# Patient Record
Sex: Male | Born: 1945 | Race: White | Hispanic: No | Marital: Married | Smoking: Never smoker
Health system: Southern US, Community
[De-identification: ages and names within clinical notes are randomized; demographics above are authoritative.]

## PROBLEM LIST (undated history)

## (undated) DIAGNOSIS — IMO0002 Reserved for concepts with insufficient information to code with codable children: Secondary | ICD-10-CM

## (undated) DIAGNOSIS — I10 Essential (primary) hypertension: Secondary | ICD-10-CM

## (undated) DIAGNOSIS — K219 Gastro-esophageal reflux disease without esophagitis: Secondary | ICD-10-CM

## (undated) DIAGNOSIS — E785 Hyperlipidemia, unspecified: Secondary | ICD-10-CM

## (undated) HISTORY — PX: APPENDECTOMY: SHX54

## (undated) HISTORY — PX: CHOLECYSTECTOMY: SHX55

---

## 2008-07-24 ENCOUNTER — Encounter: Admission: RE | Admit: 2008-07-24 | Discharge: 2008-07-24 | Payer: Self-pay | Admitting: Orthopedic Surgery

## 2013-09-29 ENCOUNTER — Inpatient Hospital Stay (HOSPITAL_COMMUNITY): Payer: Medicare HMO

## 2013-09-29 ENCOUNTER — Encounter (HOSPITAL_COMMUNITY): Payer: Self-pay | Admitting: Physician Assistant

## 2013-09-29 ENCOUNTER — Inpatient Hospital Stay (HOSPITAL_COMMUNITY)
Admission: EM | Admit: 2013-09-29 | Discharge: 2013-10-11 | DRG: 871 | Disposition: E | Payer: Medicare HMO | Source: Other Acute Inpatient Hospital | Attending: Pulmonary Disease | Admitting: Pulmonary Disease

## 2013-09-29 DIAGNOSIS — E785 Hyperlipidemia, unspecified: Secondary | ICD-10-CM | POA: Diagnosis present

## 2013-09-29 DIAGNOSIS — R40243 Glasgow coma scale score 3-8, unspecified time: Secondary | ICD-10-CM

## 2013-09-29 DIAGNOSIS — J9600 Acute respiratory failure, unspecified whether with hypoxia or hypercapnia: Secondary | ICD-10-CM | POA: Diagnosis present

## 2013-09-29 DIAGNOSIS — T50995A Adverse effect of other drugs, medicaments and biological substances, initial encounter: Secondary | ICD-10-CM | POA: Diagnosis present

## 2013-09-29 DIAGNOSIS — R319 Hematuria, unspecified: Secondary | ICD-10-CM | POA: Diagnosis present

## 2013-09-29 DIAGNOSIS — A419 Sepsis, unspecified organism: Secondary | ICD-10-CM | POA: Diagnosis present

## 2013-09-29 DIAGNOSIS — E872 Acidosis, unspecified: Secondary | ICD-10-CM | POA: Diagnosis present

## 2013-09-29 DIAGNOSIS — I214 Non-ST elevation (NSTEMI) myocardial infarction: Secondary | ICD-10-CM | POA: Diagnosis present

## 2013-09-29 DIAGNOSIS — J9690 Respiratory failure, unspecified, unspecified whether with hypoxia or hypercapnia: Secondary | ICD-10-CM | POA: Diagnosis present

## 2013-09-29 DIAGNOSIS — I4891 Unspecified atrial fibrillation: Secondary | ICD-10-CM | POA: Diagnosis present

## 2013-09-29 DIAGNOSIS — K7200 Acute and subacute hepatic failure without coma: Secondary | ICD-10-CM | POA: Diagnosis present

## 2013-09-29 DIAGNOSIS — R109 Unspecified abdominal pain: Secondary | ICD-10-CM

## 2013-09-29 DIAGNOSIS — D696 Thrombocytopenia, unspecified: Secondary | ICD-10-CM

## 2013-09-29 DIAGNOSIS — N179 Acute kidney failure, unspecified: Secondary | ICD-10-CM

## 2013-09-29 DIAGNOSIS — R40244 Other coma, without documented Glasgow coma scale score, or with partial score reported, unspecified time: Secondary | ICD-10-CM

## 2013-09-29 DIAGNOSIS — Z66 Do not resuscitate: Secondary | ICD-10-CM | POA: Diagnosis not present

## 2013-09-29 DIAGNOSIS — D6959 Other secondary thrombocytopenia: Secondary | ICD-10-CM | POA: Diagnosis present

## 2013-09-29 DIAGNOSIS — R579 Shock, unspecified: Secondary | ICD-10-CM | POA: Diagnosis present

## 2013-09-29 DIAGNOSIS — Z79899 Other long term (current) drug therapy: Secondary | ICD-10-CM | POA: Diagnosis not present

## 2013-09-29 DIAGNOSIS — E669 Obesity, unspecified: Secondary | ICD-10-CM | POA: Diagnosis present

## 2013-09-29 DIAGNOSIS — R6521 Severe sepsis with septic shock: Secondary | ICD-10-CM

## 2013-09-29 DIAGNOSIS — J9601 Acute respiratory failure with hypoxia: Secondary | ICD-10-CM

## 2013-09-29 DIAGNOSIS — R4020 Unspecified coma: Secondary | ICD-10-CM

## 2013-09-29 DIAGNOSIS — R7989 Other specified abnormal findings of blood chemistry: Secondary | ICD-10-CM

## 2013-09-29 DIAGNOSIS — M6282 Rhabdomyolysis: Secondary | ICD-10-CM | POA: Diagnosis not present

## 2013-09-29 DIAGNOSIS — I10 Essential (primary) hypertension: Secondary | ICD-10-CM | POA: Diagnosis present

## 2013-09-29 DIAGNOSIS — E869 Volume depletion, unspecified: Secondary | ICD-10-CM | POA: Diagnosis present

## 2013-09-29 DIAGNOSIS — K219 Gastro-esophageal reflux disease without esophagitis: Secondary | ICD-10-CM | POA: Diagnosis present

## 2013-09-29 DIAGNOSIS — I498 Other specified cardiac arrhythmias: Secondary | ICD-10-CM | POA: Diagnosis present

## 2013-09-29 DIAGNOSIS — IMO0002 Reserved for concepts with insufficient information to code with codable children: Secondary | ICD-10-CM | POA: Diagnosis not present

## 2013-09-29 DIAGNOSIS — R6520 Severe sepsis without septic shock: Secondary | ICD-10-CM

## 2013-09-29 DIAGNOSIS — R778 Other specified abnormalities of plasma proteins: Secondary | ICD-10-CM

## 2013-09-29 DIAGNOSIS — J95821 Acute postprocedural respiratory failure: Secondary | ICD-10-CM

## 2013-09-29 DIAGNOSIS — R4182 Altered mental status, unspecified: Secondary | ICD-10-CM | POA: Diagnosis present

## 2013-09-29 HISTORY — DX: Gastro-esophageal reflux disease without esophagitis: K21.9

## 2013-09-29 HISTORY — DX: Reserved for concepts with insufficient information to code with codable children: IMO0002

## 2013-09-29 HISTORY — DX: Hyperlipidemia, unspecified: E78.5

## 2013-09-29 HISTORY — DX: Essential (primary) hypertension: I10

## 2013-09-29 LAB — STREP PNEUMONIAE URINARY ANTIGEN: STREP PNEUMO URINARY ANTIGEN: NEGATIVE

## 2013-09-29 LAB — RENAL FUNCTION PANEL
ALBUMIN: 1.8 g/dL — AB (ref 3.5–5.2)
Anion gap: 28 — ABNORMAL HIGH (ref 5–15)
BUN: 60 mg/dL — ABNORMAL HIGH (ref 6–23)
CO2: 9 mEq/L — CL (ref 19–32)
Calcium: 6.7 mg/dL — ABNORMAL LOW (ref 8.4–10.5)
Chloride: 100 mEq/L (ref 96–112)
Creatinine, Ser: 3.07 mg/dL — ABNORMAL HIGH (ref 0.50–1.35)
GFR calc non Af Amer: 20 mL/min — ABNORMAL LOW (ref >90)
GFR, EST AFRICAN AMERICAN: 23 mL/min — AB (ref >90)
Glucose, Bld: 161 mg/dL — ABNORMAL HIGH (ref 70–99)
PHOSPHORUS: 6.5 mg/dL — AB (ref 2.3–4.6)
POTASSIUM: 4.4 meq/L (ref 3.7–5.3)
SODIUM: 137 meq/L (ref 137–147)

## 2013-09-29 LAB — BLOOD GAS, ARTERIAL
Acid-base deficit: 17.3 mmol/L — ABNORMAL HIGH (ref 0.0–2.0)
Bicarbonate: 10.8 mEq/L — ABNORMAL LOW (ref 20.0–24.0)
Drawn by: 249101
FIO2: 0.5 %
MECHVT: 620 mL
O2 Saturation: 90.4 %
PATIENT TEMPERATURE: 98.6
PCO2 ART: 38.9 mmHg (ref 35.0–45.0)
PEEP/CPAP: 5 cmH2O
PH ART: 7.072 — AB (ref 7.350–7.450)
RATE: 16 resp/min
TCO2: 12 mmol/L (ref 0–100)
pO2, Arterial: 73.1 mmHg — ABNORMAL LOW (ref 80.0–100.0)

## 2013-09-29 LAB — BASIC METABOLIC PANEL
ANION GAP: 28 — AB (ref 5–15)
BUN: 59 mg/dL — ABNORMAL HIGH (ref 6–23)
CALCIUM: 6.5 mg/dL — AB (ref 8.4–10.5)
CHLORIDE: 95 meq/L — AB (ref 96–112)
CO2: 9 mEq/L — CL (ref 19–32)
CREATININE: 3.06 mg/dL — AB (ref 0.50–1.35)
GFR calc non Af Amer: 20 mL/min — ABNORMAL LOW (ref >90)
GFR, EST AFRICAN AMERICAN: 23 mL/min — AB (ref >90)
Glucose, Bld: 196 mg/dL — ABNORMAL HIGH (ref 70–99)
Potassium: 4.6 mEq/L (ref 3.7–5.3)
SODIUM: 132 meq/L — AB (ref 137–147)

## 2013-09-29 LAB — URINALYSIS, ROUTINE W REFLEX MICROSCOPIC
Glucose, UA: NEGATIVE mg/dL
Ketones, ur: 15 mg/dL — AB
Nitrite: POSITIVE — AB
PH: 5 (ref 5.0–8.0)
Protein, ur: 30 mg/dL — AB
SPECIFIC GRAVITY, URINE: 1.039 — AB (ref 1.005–1.030)
Urobilinogen, UA: 1 mg/dL (ref 0.0–1.0)

## 2013-09-29 LAB — COMPREHENSIVE METABOLIC PANEL
ALT: 90 U/L — ABNORMAL HIGH (ref 0–53)
AST: 200 U/L — ABNORMAL HIGH (ref 0–37)
Albumin: 1.8 g/dL — ABNORMAL LOW (ref 3.5–5.2)
Alkaline Phosphatase: 84 U/L (ref 39–117)
Anion gap: 27 — ABNORMAL HIGH (ref 5–15)
BUN: 59 mg/dL — ABNORMAL HIGH (ref 6–23)
CO2: 10 meq/L — AB (ref 19–32)
CREATININE: 3.08 mg/dL — AB (ref 0.50–1.35)
Calcium: 6.8 mg/dL — ABNORMAL LOW (ref 8.4–10.5)
Chloride: 101 mEq/L (ref 96–112)
GFR, EST AFRICAN AMERICAN: 23 mL/min — AB (ref >90)
GFR, EST NON AFRICAN AMERICAN: 19 mL/min — AB (ref >90)
GLUCOSE: 108 mg/dL — AB (ref 70–99)
Potassium: 4.5 mEq/L (ref 3.7–5.3)
Sodium: 138 mEq/L (ref 137–147)
Total Bilirubin: 4.8 mg/dL — ABNORMAL HIGH (ref 0.3–1.2)
Total Protein: 3.8 g/dL — ABNORMAL LOW (ref 6.0–8.3)

## 2013-09-29 LAB — TROPONIN I
TROPONIN I: 2.9 ng/mL — AB (ref <0.30)
Troponin I: 1.9 ng/mL (ref <0.30)

## 2013-09-29 LAB — CBC
HCT: 47.2 % (ref 39.0–52.0)
HEMOGLOBIN: 16.2 g/dL (ref 13.0–17.0)
MCH: 31.3 pg (ref 26.0–34.0)
MCHC: 34.3 g/dL (ref 30.0–36.0)
MCV: 91.1 fL (ref 78.0–100.0)
Platelets: 65 10*3/uL — ABNORMAL LOW (ref 150–400)
RBC: 5.18 MIL/uL (ref 4.22–5.81)
RDW: 14.2 % (ref 11.5–15.5)
WBC: 16.2 10*3/uL — ABNORMAL HIGH (ref 4.0–10.5)

## 2013-09-29 LAB — GLUCOSE, CAPILLARY
Glucose-Capillary: 99 mg/dL (ref 70–99)
Glucose-Capillary: 158 mg/dL — ABNORMAL HIGH (ref 70–99)

## 2013-09-29 LAB — PRO B NATRIURETIC PEPTIDE: PRO B NATRI PEPTIDE: 29878 pg/mL — AB (ref 0–125)

## 2013-09-29 LAB — LIPASE, BLOOD: Lipase: 30 U/L (ref 11–59)

## 2013-09-29 LAB — TSH: TSH: 1.69 u[IU]/mL (ref 0.350–4.500)

## 2013-09-29 LAB — URINE MICROSCOPIC-ADD ON

## 2013-09-29 LAB — MAGNESIUM: MAGNESIUM: 2.3 mg/dL (ref 1.5–2.5)

## 2013-09-29 LAB — PHOSPHORUS: Phosphorus: 6.3 mg/dL — ABNORMAL HIGH (ref 2.3–4.6)

## 2013-09-29 LAB — LACTIC ACID, PLASMA: Lactic Acid, Venous: 11.1 mmol/L — ABNORMAL HIGH (ref 0.5–2.2)

## 2013-09-29 LAB — PROTIME-INR
INR: 1.28 (ref 0.00–1.49)
PROTHROMBIN TIME: 16 s — AB (ref 11.6–15.2)

## 2013-09-29 LAB — AMYLASE: Amylase: 182 U/L — ABNORMAL HIGH (ref 0–105)

## 2013-09-29 LAB — PROCALCITONIN: PROCALCITONIN: 16.46 ng/mL

## 2013-09-29 LAB — DIGOXIN LEVEL: DIGOXIN LVL: 1.6 ng/mL (ref 0.8–2.0)

## 2013-09-29 MED ORDER — SODIUM BICARBONATE 8.4 % IV SOLN
INTRAVENOUS | Status: DC
  Administered 2013-09-29 – 2013-09-30 (×3): via INTRAVENOUS_CENTRAL
  Filled 2013-09-29 (×5): qty 150

## 2013-09-29 MED ORDER — AMIODARONE HCL IN DEXTROSE 360-4.14 MG/200ML-% IV SOLN
60.0000 mg/h | INTRAVENOUS | Status: DC
  Filled 2013-09-29: qty 200

## 2013-09-29 MED ORDER — FENTANYL CITRATE 0.05 MG/ML IJ SOLN
0.0000 ug/h | INTRAMUSCULAR | Status: DC
  Administered 2013-09-29: 50 ug/h via INTRAVENOUS
  Filled 2013-09-29: qty 50

## 2013-09-29 MED ORDER — SODIUM CHLORIDE 0.9 % IV SOLN
INTRAVENOUS | Status: DC
  Administered 2013-09-29 – 2013-09-30 (×2): via INTRAVENOUS

## 2013-09-29 MED ORDER — FENTANYL CITRATE 0.05 MG/ML IJ SOLN: 50.0000 ug | Freq: Once | INTRAMUSCULAR | Status: DC

## 2013-09-29 MED ORDER — PANTOPRAZOLE SODIUM 40 MG IV SOLR
40.0000 mg | Freq: Every day | INTRAVENOUS | Status: DC
  Administered 2013-09-29: 40 mg via INTRAVENOUS
  Filled 2013-09-29 (×2): qty 40

## 2013-09-29 MED ORDER — VANCOMYCIN HCL IN DEXTROSE 1-5 GM/200ML-% IV SOLN
1000.0000 mg | INTRAVENOUS | Status: DC
  Filled 2013-09-29: qty 200

## 2013-09-29 MED ORDER — PHENYLEPHRINE HCL 10 MG/ML IJ SOLN
30.0000 ug/min | INTRAVENOUS | Status: DC
  Administered 2013-09-29 – 2013-09-30 (×5): 200 ug/min via INTRAVENOUS
  Filled 2013-09-29 (×5): qty 4

## 2013-09-29 MED ORDER — HEPARIN (PORCINE) 2000 UNITS/L FOR CRRT
INTRAVENOUS_CENTRAL | Status: DC | PRN
  Filled 2013-09-29: qty 1000

## 2013-09-29 MED ORDER — SODIUM CHLORIDE 0.9 % IV SOLN
500.0000 mg | Freq: Four times a day (QID) | INTRAVENOUS | Status: DC
  Administered 2013-09-30 (×2): 500 mg via INTRAVENOUS
  Filled 2013-09-29 (×4): qty 500

## 2013-09-29 MED ORDER — PIPERACILLIN-TAZOBACTAM 3.375 G IVPB 30 MIN
3.3750 g | INTRAVENOUS | Status: AC
  Administered 2013-09-29: 3.375 g via INTRAVENOUS
  Filled 2013-09-29: qty 50

## 2013-09-29 MED ORDER — NOREPINEPHRINE BITARTRATE 1 MG/ML IV SOLN
2.0000 ug/min | INTRAVENOUS | Status: DC
  Administered 2013-09-29: 25 ug/min via INTRAVENOUS
  Administered 2013-09-29: 50 ug/min via INTRAVENOUS
  Filled 2013-09-29 (×4): qty 16

## 2013-09-29 MED ORDER — CHLORHEXIDINE GLUCONATE 0.12 % MT SOLN
OROMUCOSAL | Status: AC
  Administered 2013-09-29: 15 mL
  Filled 2013-09-29: qty 15

## 2013-09-29 MED ORDER — FENTANYL BOLUS VIA INFUSION
25.0000 ug | INTRAVENOUS | Status: DC | PRN
  Filled 2013-09-29: qty 50

## 2013-09-29 MED ORDER — CHLORHEXIDINE GLUCONATE 0.12 % MT SOLN
15.0000 mL | Freq: Two times a day (BID) | OROMUCOSAL | Status: DC
  Administered 2013-09-30: 15 mL via OROMUCOSAL
  Filled 2013-09-29: qty 15

## 2013-09-29 MED ORDER — HYDROCORTISONE NA SUCCINATE PF 100 MG IJ SOLR
100.0000 mg | Freq: Four times a day (QID) | INTRAMUSCULAR | Status: DC
  Administered 2013-09-29 – 2013-09-30 (×4): 100 mg via INTRAVENOUS
  Filled 2013-09-29 (×8): qty 2

## 2013-09-29 MED ORDER — CETYLPYRIDINIUM CHLORIDE 0.05 % MT LIQD
7.0000 mL | Freq: Four times a day (QID) | OROMUCOSAL | Status: DC
  Administered 2013-09-30 (×2): 7 mL via OROMUCOSAL

## 2013-09-29 MED ORDER — PRISMASOL BGK 4/2.5 32-4-2.5 MEQ/L IV SOLN
INTRAVENOUS | Status: DC
  Administered 2013-09-29 – 2013-09-30 (×5): via INTRAVENOUS_CENTRAL
  Filled 2013-09-29 (×10): qty 5000

## 2013-09-29 MED ORDER — VANCOMYCIN HCL 10 G IV SOLR
1750.0000 mg | Freq: Once | INTRAVENOUS | Status: AC
  Administered 2013-09-29: 1750 mg via INTRAVENOUS
  Filled 2013-09-29: qty 1750

## 2013-09-29 MED ORDER — VASOPRESSIN 20 UNIT/ML IJ SOLN
0.0300 [IU]/min | INTRAVENOUS | Status: DC
  Administered 2013-09-29: 0.03 [IU]/min via INTRAVENOUS
  Filled 2013-09-29 (×2): qty 2

## 2013-09-29 MED ORDER — SODIUM CHLORIDE 0.9 % IV SOLN: 250.0000 mL | INTRAVENOUS | Status: DC | PRN

## 2013-09-29 MED ORDER — HEPARIN SODIUM (PORCINE) 1000 UNIT/ML DIALYSIS
1000.0000 [IU] | INTRAMUSCULAR | Status: DC | PRN
  Administered 2013-09-29: 2400 [IU] via INTRAVENOUS_CENTRAL
  Filled 2013-09-29: qty 1
  Filled 2013-09-29: qty 6
  Filled 2013-09-29: qty 2

## 2013-09-29 MED ORDER — NOREPINEPHRINE BITARTRATE 1 MG/ML IV SOLN
2.0000 ug/min | INTRAVENOUS | Status: DC
  Administered 2013-09-29: 50 ug/min via INTRAVENOUS
  Filled 2013-09-29: qty 4

## 2013-09-29 MED ORDER — AMIODARONE HCL IN DEXTROSE 360-4.14 MG/200ML-% IV SOLN: 30.0000 mg/h | INTRAVENOUS | Status: DC

## 2013-09-29 MED ORDER — VANCOMYCIN HCL IN DEXTROSE 1-5 GM/200ML-% IV SOLN: 1000.0000 mg | INTRAVENOUS | Status: DC

## 2013-09-29 MED ORDER — SODIUM CHLORIDE 0.9 % IV SOLN
500.0000 mg | INTRAVENOUS | Status: AC
  Administered 2013-09-29: 500 mg via INTRAVENOUS
  Filled 2013-09-29: qty 500

## 2013-09-29 MED ORDER — DEXTROSE 5 % IV SOLN
INTRAVENOUS | Status: DC
  Administered 2013-09-29 (×2): via INTRAVENOUS
  Filled 2013-09-29 (×3): qty 150

## 2013-09-29 MED ORDER — DOXYCYCLINE HYCLATE 100 MG IV SOLR
100.0000 mg | Freq: Two times a day (BID) | INTRAVENOUS | Status: DC
  Administered 2013-09-29 – 2013-09-30 (×2): 100 mg via INTRAVENOUS
  Filled 2013-09-29 (×3): qty 100

## 2013-09-29 MED ORDER — PRISMASOL BGK 4/2.5 32-4-2.5 MEQ/L IV SOLN
INTRAVENOUS | Status: DC
  Administered 2013-09-29 – 2013-09-30 (×2): via INTRAVENOUS_CENTRAL
  Filled 2013-09-29 (×4): qty 5000

## 2013-09-29 NOTE — Consult Note (Signed)
CARDIOLOGY CONSULT NOTE   Patient ID: Jared Ingram MRN: [id number] DOB/AGE: 67-Apr-1947 67 y.o.  Admit date: 10/05/2013  Primary Physician   Lucila Maine, MD Primary Cardiologist   New Reason for Consultation   Shock, elevated troponin  WUJ:WJXB Jared Ingram is a 68 y.o. male with no history of CAD. History of HLD, HTN, GERD. He had been on Levaquin prior to admission to [facility] on 08/17, reason unclear. He was admitted for N&V that had started 4 days prior to the date of admission, hematuria. He was hypotensive, and GI symptoms were difficult to control.   Initial labs: WBC 8.1 77% SEGS   HGB 17.1    HCT 50.0    MCV 91    PLT 76         NA 133    K 4.1    CL 97    CO2 28    ANION GAP  12    BUN 31    CR 1.30    GLU 175    CA 8.2    T BILI 3.1         LFTs OK                He deteriorated on 08/19 from the notes, continuing to have fevers. He was intubated and required pressors. His bilirubin and transaminases continued to increase. His UOP decreased. His platelets were low on admission, and decreased. He was acidotic and this worsened. A central line was placed.   A d-dimer was elevated on 08/19 and a CTA was performed, negative for PE.   He developed atrial fibrillation with rapid ventricular response and had elevated cardiac enzymes, indicating a NSTEMI. He was started on digoxin, no BB/CCB due to hypotension.   He was transferred to [facility], to the hospitalist service and cardiology was asked to evaluate him.   [facility] labs from today:  ABG: 7.29 25   PCO2 70  PO2  12   HCO3            WBC 6.3    HGB 16.7    HCT 49.6    PLT 38         NA 133    K 4.2    CL 100    CO2 17    BUN 20    CR 2.40    GLU 104    CA 7.1    Corrected CA 8.9    T PROT 4.3    ALB 2.2    T BILI 7.0     Troponin I: 1.16 1.79 2.04 1.40     Past Medical History  Diagnosis Date  . GERD (gastroesophageal reflux disease)   . Essential hypertension   .  Herniated disc   . Hyperlipidemia      Past Surgical History  Procedure Laterality Date  . Appendectomy    . Cholecystectomy      Allergies  Allergen Reactions  . Sulfa Antibiotics Other (See Comments)    unspecified    I have reviewed the patient's current medications . doxycycline (VIBRAMYCIN) IV  100 mg Intravenous Q12H  . hydrocortisone sod succinate (SOLU-CORTEF) inj  100 mg Intravenous Q6H  . imipenem-cilastatin  500 mg Intravenous STAT  . pantoprazole (PROTONIX) IV  40 mg Intravenous QHS  . vancomycin  1,750 mg Intravenous Once   . sodium chloride 100 mL/hr at 10/09/2013 1434  . norepinephrine (LEVOPHED) Adult infusion 50 mcg/min (09/16/2013 1451)  .  phenylephrine (NEO-SYNEPHRINE) Adult infusion 200 mcg/min (09/21/2013 1400)  .  sodium bicarbonate  infusion 1000 mL 125 mL/hr at 10/04/2013 1440  . vasopressin (PITRESSIN) infusion - *FOR SHOCK* 0.03 Units/min (09/24/2013 1441)   sodium chloride  Prior to Admission medications   Medication Sig Start Date  levofloxacin (LEVAQUIN) 500 MG tablet Take 500 mg by mouth 2 (two) times daily. 09/24/13  losartan (COZAAR) 25 MG tablet Take 12.5 mg by mouth daily. 08/09/13  pantoprazole (PROTONIX) 40 MG tablet Take 40 mg by mouth daily. 09/23/13  pravastatin (PRAVACHOL) 20 MG tablet Take 20 mg by mouth daily. 08/31/13  predniSONE (DELTASONE) 10 MG tablet Take 10 mg by mouth daily. 09/24/13        History   Social History  . Marital Status: Married    Spouse Name: N/A    Number of Children: N/A  . Years of Education: N/A   Occupational History  . Retired   Social History Main Topics  . Smoking status: Never  . Smokeless tobacco: Never  . Alcohol Use: No  . Drug Use: Never  . Sexual Activity: Not on file   Other Topics Concern  . Not on file   Social History Narrative  . Lives with wife    Family Status - no further information available due to patient condition  Relation Status Death Age  . Mother Deceased   . Father  Deceased      ROS:  Not available due to patient condition  Physical Exam:  Pulse 84, resp. rate 16, height 5\' 11"  (1.803 m), weight 223 lb 12.3 oz (101.5 kg), SpO2 90.00%.  General: Well developed, well nourished, male intubated on the vent. Head: Eyes PERRLA, No xanthomas.   Normocephalic and atraumatic, oropharynx without edema or exudate.  Lungs: clear anteriorly Heart: HRRR S1 S2, no rub/gallop, soft systolic murmur. pulses are 2+ upper extrem.  Decreased in both LE with delayed capillary refill Neck: No carotid bruits. No lymphadenopathy.  JVD not elevated Abdomen: Bowel sounds absent, abdomen soft and non-tender without masses or hernias noted. Msk: No joint deformities or effusions. Extremities: No clubbing or cyanosis. No edema.  Neuro: Unresponsive on the vent, not requiring sedation, possibly got paralytics prior to transfer Skin: No rashes or lesions noted.  [facility]    Component Value Date/Time   PHART 7.072* 09/22/2013 1425   PCO2ART 38.9 10/08/2013 1425   PO2ART 73.1* 09/17/2013 1425   HCO3 10.8* 10/03/2013 1425   TCO2 12.0 09/15/2013 1425   ACIDBASEDEF 17.3* 09/11/2013 1425   O2SAT 90.4 09/16/2013 1425   Lab Results  Component Value Date   WBC 16.2* 10/07/2013    Recent Labs  09/15/2013 1343  INR 1.28     Recent Labs Lab 10/08/2013 1343  NA 138  K 4.5  CL 101  CO2 10*  BUN 59*  CREATININE 3.08*  CALCIUM 6.8*  PROT 3.8*  BILITOT 4.8*  ALKPHOS 84  ALT 90*  AST 200*  GLUCOSE 108*  ALBUMIN 1.8*   Magnesium  Date Value Ref Range Status  09/21/2013 2.3  1.5 - 2.5 mg/dL Final    Recent Labs  16/11/9606/28/2015 1343  TROPONINI 1.90*   Pro B Natriuretic peptide (BNP)  Date/Time Value Ref Range Status  09/24/2013  1:43 PM 29878.0* 0 - 125 pg/mL Final   Lipase  Date/Time Value Ref Range Status  09/22/2013  1:43 PM 30  11 - 59 U/L Final   TSH  Date/Time Value Ref Range Status  10-08-2013  2:00 PM 1.690  0.350 - 4.500 uIU/mL Final   Lab Results    Component Value Date   DIGOXIN 1.6 2013/10/08    Echo: pending  ECG:  Pending  Radiology:  Dg Chest Port 1 View 2013-10-08   CLINICAL DATA:  Respiratory failure.  EXAM: PORTABLE CHEST - 1 VIEW  COMPARISON:  Oct 08, 2013.  FINDINGS: Endotracheal tube, left IJ line, NG tube in stable position. Low lung volumes with basilar atelectasis. Mild basilar infiltrates cannot be excluded. Small left pleural effusion cannot be excluded. Cardiomegaly. No prominent pulmonary venous congestion. No acute osseous abnormality.  IMPRESSION: 1. Line and tube stable in position. 2. Low lung volumes with basilar atelectasis and/or mild infiltrates. Small left pleural effusion cannot be excluded. 3. Cardiomegaly.  No pulmonary venous distention.   Electronically Signed   By: Maisie Fus  Register   On: Oct 08, 2013 14:20    ASSESSMENT AND PLAN:   The patient was seen today by Dr. Patty Sermons, the patient evaluated and the data reviewed.   68 yo male w/ no previous cardiac issues was transferred from [facility] for treatment of sepsis, unclear cause, associated with acute renal failure, and severe metabolic acidosis.   Atrial fibrillation with rapid ventricular response - spontaneously converted to SR, ECG pending, follow, dig level elevated, with decreased renal function, would not use.      Non-ST elevation myocardial infarction (NSTEMI), initial episode of care - LIkely a type 2 NSTEMI in the setting of a severe illness. Options are limited by hypotension and poor renal function. Can add ASA suppository, but no statin with abnl LFTs, no BB with hypotension. Check echo.   Otherwise, per CCM, Nephrology, Infectious Disease and other specialists. Active Problems:   Respiratory failure   Septic shock   Acute renal failure   Shock liver   Altered mental status  Signed: Theodore Demark, PA-C 2013-10-08 3:56 PM Beeper [phone]  Co-Sign MD Agree with assessment and plan as noted above by Theodore Demark PAC.  This man was in  good health until about 4 days ago.  He is normally very physically active.  He is on very little medication.  He takes a small amount of losartan 12.5 mg daily, pravastatin for hypercholesterolemia, and a PPI.  He worked in his garden last weekend.  The wife mentions that he may have had an insect bite on his right foot.  There had been a erythematous ring which has subsequently resolved.  The patient does not have any history to suggest angina pectoris or ischemic heart disease according to the wife. His examination at this time reveals an intubated gentleman on multiple pressors.  The skin is warm.  The lungs are clear anteriorly.  The heart reveals no murmur gallop rub or click.  The abdomen is soft and there is no apparent tenderness although the patient is sedated.  Bowel sounds are absent.  No masses are felt.  Extremities reveal pedal pulses present.  There is no significant rash. An EKG has not yet been done but has been ordered.  His monitor at present time shows normal sinus rhythm with occasional PVCs and PACs. Sequence of events would suggest possible sepsis as the cause of his mildly elevated troponins.  History does not suggest primary cardiac event.  Serial EKGs will be of help.  We will assess his LV function with echocardiogram. Will follow with you.

## 2013-09-29 NOTE — Care Management Note (Signed)
    Page 1 of 1   10/08/2013     2:43:09 PM CARE MANAGEMENT NOTE 10/01/2013  Patient:  Jared Ingram,Jared Ingram   Account Number:  [id number]  Date Initiated:  09/14/2013  Documentation initiated by:  Junius CreamerWELL,DEBBIE  Subjective/Objective Assessment:   adm w n&v,hematuria     Action/Plan:   lives w wife, pcp dr Molly Madurorobert scott   Anticipated DC Date:     Anticipated DC Plan:           Choice offered to / List presented to:             Status of service:   Medicare Important Message given?   (If response is "NO", the following Medicare IM given date fields will be blank) Date Medicare IM given:   Medicare IM given by:   Date Additional Medicare IM given:   Additional Medicare IM given by:    Discharge Disposition:    Per UR Regulation:  Reviewed for med. necessity/level of care/duration of stay  If discussed at Long Length of Stay Meetings, dates discussed:    Comments:

## 2013-09-29 NOTE — Procedures (Signed)
Central Venous Trialysis Catheter Insertion Procedure Note Jared Ingram [id number] [birth year]  Procedure: Insertion of Central Venous Catheter Indications: Dialysis access  Procedure Details Consent: Risks of procedure as well as the alternatives and risks of each were explained to the (patient/caregiver).  Consent for procedure obtained. Time Out: Verified patient identification, verified procedure, site/side was marked, verified correct patient position, special equipment/implants available, medications/allergies/relevent history reviewed, required imaging and test results available.  Performed  Maximum sterile technique was used including antiseptics, cap, gloves, gown, hand hygiene, mask and sheet. Skin prep: Chlorhexidine; local anesthetic administered A antimicrobial bonded/coated triple lumen catheter was placed in the right internal jugular vein using the Seldinger technique.  Evaluation Blood flow good Complications: No apparent complications Patient did tolerate procedure well. Chest X-ray ordered to verify placement.  CXR: pending.  U/S used in placement.  Jared Ingram,Jared Ingram 09/15/2013, 4:30 PM

## 2013-09-29 NOTE — H&P (Signed)
PULMONARY / CRITICAL CARE MEDICINE   Name: Jared Ingram MRN: [id number] DOB: [birth year]    ADMISSION DATE:  10/06/2013   REFERRING MD :  Duke Salvia CO. MD  CHIEF COMPLAINT:  Shock  INITIAL PRESENTATION: Nausea, vomiting, hematuria, abd/back pain 8/17  STUDIES:  8/18 ct abd reported negative  SIGNIFICANT EVENTS: 8/20 transported to [facility]   HISTORY OF PRESENT ILLNESS:   68 yo m who presented to his PCP 8/17 with 3 days of N/V abd/back pain and hematuria. He was then admitted to [facility]. Ct od Abd was negative, treated with antiemetics and abx but continue to decline. He developed + trop with peak 2.01 8/19 and 1.4 on 8/20 and reported normal 2 d echo. He declined despite all interventions and was intubated, placed on pressors and transported to [facility] hospital fro definitive care.    PAST MEDICAL HISTORY :  GERD HTN  Appendectomy Cholecystectomy  Prior to Admission medications   reviewed   NKDA  FAMILY HISTORY:  No family history on file. SOCIAL HISTORY:  has no tobacco, alcohol, and drug history on file.  REVIEW OF SYSTEMS:  NA  SUBJECTIVE:   VITAL SIGNS: FiO2 (%):  [50 %] 50 % (08/20 1325) HEMODYNAMICS:   VENTILATOR SETTINGS: Vent Mode:  [-] PRVC FiO2 (%):  [50 %] 50 % Set Rate:  [16 bmp] 16 bmp Vt Set:  [620 mL] 620 mL PEEP:  [5 cmH20] 5 cmH20 Plateau Pressure:  [16 cmH20] 16 cmH20 INTAKE / OUTPUT: No intake or output data in the 24 hours ending 09/16/2013 1447  PHYSICAL EXAMINATION: General:  WNWDWM sedated on vent, paralyzed Neuro:  Sedated on vent .  HEENT: PERL 4 mm, no jvd/lan Cardiovascular:  HSR RRR on amio drip Lungs:  CTA Abdomen: Soft + bs Musculoskeletal:  intact Skin:  Cool, no edema, toes cool and mottled  LABS:  CBC No results found for this basename: WBC, HGB, HCT, PLT,  in the last 168 hours Coag's No results found for this basename: APTT, INR,  in the last 168 hours BMET No results found for this basename: NA, K,  CL, CO2, BUN, CREATININE, GLUCOSE,  in the last 168 hours Electrolytes No results found for this basename: CALCIUM, MG, PHOS,  in the last 168 hours Sepsis Markers No results found for this basename: LATICACIDVEN, PROCALCITON, O2SATVEN,  in the last 168 hours ABG  Recent Labs Lab 09/15/2013 1425  PHART 7.072*  PCO2ART 38.9  PO2ART 73.1*   Liver Enzymes No results found for this basename: AST, ALT, ALKPHOS, BILITOT, ALBUMIN,  in the last 168 hours Cardiac Enzymes No results found for this basename: TROPONINI, PROBNP,  in the last 168 hours Glucose No results found for this basename: GLUCAP,  in the last 168 hours  Imaging No results found.   ASSESSMENT / PLAN:  PULMONARY OETT 8/20>> A: VDRF secondary to shock, acidosis. ?Pna P:   Vent bundle. ABG with severe metabolic acidosis, will adjust vent to compensate as able CXR now. CXR and ABG in AM. Bicarb drip.  CARDIOVASCULAR CVL left I J 8/20>> A:  Hypotension Afib rvr Elevated trop I Presumed sepsis P:  Monitor CVP Pressors and fluids - Levo 50, Neo 200 and Vaso 0.03. Stress steroids. D/C amio due to bradycardia Cycle cardiac enzymes 12 lead Cards consult called. Will need to address acidosis inorder for pressors to be effective  RENAL A:   Hematuria Renal insuff Metabolic acidosis P:   Follow creatine closely Avoid nephrotoxins Hydration CVP  monitoring MRI of torso when stable for ?back infection (does not appear so on CT after review with radiology) Bicarb drip Renal US for completeness Renal consult called for oliguria and refractory acidosis.  GASTROINTESTINAL A:   Nausea and vomiting CT abd negative at [place] P:   OGT -> lwis NPO Consult nutrition for TF as per nutrition in AM. Check for C. Diff.  HEMATOLOGIC A:   No acute issue P:  DVT prophylaxis with scd  INFECTIOUS A:   Presumed infectious state, ? Lung vs other P:   BCx2 8/20>> UC 8/20>> Sputum 8/20>> Abx: vanc, start  date 8/20, day 0/x Abx: Pip-tazo, start date 8/20, day 0/x 8/20 procalcitonin>> 8/20 lactic acid>>  ENDOCRINE A:   Recent steroid use  P:   Stress steroids in shock state Cortisol level  NEUROLOGIC A:   Decreased LOC but just placed on sedation for transport to [facility] P:   RASS goal: -1 Wake up assessment to evaluate mental status , may need ct of head in future.  Brett CanalesSteve Minor ACNP [facility] P[facility]M Pager [phone] till 3 pm If no answer page [phone]  TODAY'S SUMMARY:  68 [facility] after following 3 day decline with subsequent intubation and treatment for shock.  Will call renal, ID and cards to evaluate patient today.  All repeat studies ordered.  DDx of PNA vs occult infection in the abdomen.  Sepsis is very high on the list.  Hoping that if we CVVH patient or at least responds to bicarb then pressors demand decrease.  ID to assist with searching for source.  Cards to evaluate elevated troponin but would highly doubt is a candidate for any procedures at this time.  I have personally obtained a history, examined the patient, evaluated laboratory and imaging results, formulated the assessment and plan and placed orders.  CRITICAL CARE: The patient is critically ill with multiple organ systems failure and requires high complexity decision making for assessment and support, frequent evaluation and titration of therapies, application of advanced monitoring technologies and extensive interpretation of multiple databases. Critical Care Time devoted to patient care services described in this note is 90 minutes.   Alyson ReedyWesam G. Stonewall Doss, M.D. [facility] Pulmonary/Critical Care Medicine. Pager: [phone]. After hours pager: [phone].  09/17/2013, 2:47 PM     09/12/2013, 2:47 PM

## 2013-09-29 NOTE — Progress Notes (Signed)
Spoke with all consultants, after discussion, thought is that source remains unknown.  Abx changed as per ID.  Surgery does not feel the patient is a surgical candidate as of now.  Cardiology's input appreciated.  Will place an HD catheter for CVVH with the hope that this will address acidosis and hopefully decrease pressor demand.    Additional CC time of 35 min.  Alyson ReedyWesam G. Yacoub, M.D. [facility] Pulmonary/Critical Care Medicine. Pager: [phone]. After hours pager: [phone].

## 2013-09-29 NOTE — Progress Notes (Signed)
  Echocardiogram 2D Echocardiogram has been performed.  Jared Ingram, Jared Ingram 10/01/2013, 6:13 PM

## 2013-09-29 NOTE — Progress Notes (Signed)
ANTIBIOTIC CONSULT NOTE - INITIAL  Pharmacy Consult for Vanco/Primaxin Indication: Sepsis, r/o meningitis  Allergies  Allergen Reactions  . Sulfa Antibiotics Other (See Comments)    Bleeding-Listed per [facility] MAR    Patient Measurements: Height: 5\' 11"  (180.3 cm) Weight: 223 lb 12.3 oz (101.5 kg) IBW/kg (Calculated) : 75.3 Adjusted Body Weight:    Vital Signs: Temp: 98.2 F (36.8 C) (08/20 1617) Temp src: Oral (08/20 1617) Pulse Rate: 84 (08/20 1325) Intake/Output from previous day:   Intake/Output from this shift:    Labs:  Recent Labs  09/23/2013 1343  WBC 16.2*  HGB 16.2  PLT 65*  CREATININE 3.08*   Estimated Creatinine Clearance: 28.2 ml/min (by C-G formula based on Cr of 3.08). No results found for this basename: VANCOTROUGH, VANCOPEAK, VANCORANDOM, GENTTROUGH, GENTPEAK, GENTRANDOM, TOBRATROUGH, TOBRAPEAK, TOBRARND, AMIKACINPEAK, AMIKACINTROU, AMIKACIN,  in the last 72 hours   Microbiology: No results found for this or any previous visit (from the past 720 hour(s)).  Medical History: Past Medical History  Diagnosis Date  . GERD (gastroesophageal reflux disease)   . Essential hypertension   . Herniated disc   . Hyperlipidemia     Medications:  Prescriptions prior to admission  Medication Sig Dispense Refill  . levofloxacin (LEVAQUIN) 500 MG tablet Take 500 mg by mouth daily.       Marland Kitchen. losartan (COZAAR) 25 MG tablet Take 12.5 mg by mouth every morning.       . Multiple Vitamin (MULTIVITAMIN WITH MINERALS) TABS tablet Take 1 tablet by mouth every morning.       . naproxen sodium (ANAPROX) 220 MG tablet Take 220 mg by mouth daily as needed (for pain).      . pantoprazole (PROTONIX) 40 MG tablet Take 40 mg by mouth every morning.       . pravastatin (PRAVACHOL) 20 MG tablet Take 20 mg by mouth at bedtime.       . predniSONE (STERAPRED UNI-PAK) 10 MG tablet Take 10-60 tablets by mouth daily.      . promethazine (PHENERGAN) 25 MG tablet Take 25 mg by  mouth every 6 (six) hours as needed for nausea or vomiting.       Assessment: 68 [facility] with hx of nausea, vomiting, hematuria, abdominal & back pain. Source unclear. [facility] Patient with AKI with SCr [facility] 1.8 >> 2.4. Presumed sepsis with possible source lung vs GI.   Anticoagulation: None with hematuria and plts 62. SCDS  Infectious Disease: Sepsis. Abs initated to include CNS penetration and coverage for RMSF. WBC 16.2. Afebrile. Procalcitonin 16.46. 8/20 Doxy>> 8/20 Vanco>> 8/20 Primaxin>> 8/20 Zosyn x 1  Cardiovascular: h/o CAD, HLD, HTN now with shock, afib>>NSR, NSTEMI. proBNP [phone],[id number]. Pressors: Levophed, Neo, Vasopressing  Endocrinology: TSH 1.69 ok. Stress dose IV HC  Gastrointestinal / Nutrition: Shock liver. Amylase 182 up, Lipase 30 WNL, AST/ALT 200/90  Neurology: h/o herniated disk  Nephrology: ARF, metabolis acidosis (lactic acidosis) on bicarb drip. Start CVVHD. Scr 3.08  Pulmonary: CT negative for PE 8/19. VDRF  Hematology / Oncology: Hgb 16.2.  PTA Medication Issues: losartan, MV, Pravachol,   Best Practices: IV PPI, SCDs   Goal of Therapy:  Vancomycin trough level 15-20 mcg/ml  Plan:  Primaxin 500mg  IV q6hrs (CVVHD dosing) Vanco 1000mg  IV q24h (CVVHD dosing) Doxycycline 100mg  IV q12h (no adjustment required)  Jared Ingram, PharmD, BCPS Clinical Staff Pharmacist Pager [phone]  Jared Ingram, Jared Ingram 09/28/2013,4:50 PM

## 2013-09-29 NOTE — Consult Note (Signed)
Reason for Consult: abdominal pain, r/o peritonitis Referring Physician: Dr. Fatima Blank  Jared Ingram is an 68 y.o. male.  HPI: the patient presented to [facility] with reported abdominal pain, back pain and hematuria on the 17th.  At this time he had an unremarkable CT of abdomen and pelvis.  He then began to decline, had positive troponin, worsening renal function.  He was then transported to [facility].  He is currently intubated, on vasopressors.  He has a white count of 16K, pH of 7, platelet count of 65k, sCr 3.  We have been asked to evaluate the patient for questionable peritonitis.    Past Medical History  Diagnosis Date  . GERD (gastroesophageal reflux disease)   . Essential hypertension   . Herniated disc   . Hyperlipidemia     Past Surgical History  Procedure Laterality Date  . Appendectomy    . Cholecystectomy      History reviewed. No pertinent family history.  Social History:  reports that he has never smoked. He has never used smokeless tobacco. He reports that he does not drink alcohol or use illicit drugs.  Allergies:  Allergies  Allergen Reactions  . Sulfa Antibiotics Other (See Comments)    Bleeding-Listed per [facility]    Medications:  Scheduled Meds: . doxycycline (VIBRAMYCIN) IV  100 mg Intravenous Q12H  . hydrocortisone sod succinate (SOLU-CORTEF) inj  100 mg Intravenous Q6H  . pantoprazole (PROTONIX) IV  40 mg Intravenous QHS  . vancomycin  1,750 mg Intravenous Once   Continuous Infusions: . sodium chloride 100 mL/hr at 09/11/2013 1434  . norepinephrine (LEVOPHED) Adult infusion 50 mcg/min (09/19/2013 1451)  . phenylephrine (NEO-SYNEPHRINE) Adult infusion 200 mcg/min (10/08/2013 1400)  . dialysis replacement fluid (prismasate)    . dialysate (PRISMASATE)    . sodium bicarbonate (isotonic) 1000 mL infusion    . vasopressin (PITRESSIN) infusion - *FOR SHOCK* 0.03 Units/min (09/15/2013 1441)   PRN Meds:.sodium chloride, heparin,  heparin   Results for orders placed during the hospital encounter of 09/14/2013 (from the past 48 hour(s))  COMPREHENSIVE METABOLIC PANEL     Status: Abnormal   Collection Time    09/20/2013  1:43 PM      Result Value Ref Range   Sodium 138  137 - 147 mEq/L   Potassium 4.5  3.7 - 5.3 mEq/L   Chloride 101  96 - 112 mEq/L   CO2 10 (*) 19 - 32 mEq/L   Comment: CRITICAL RESULT CALLED TO, READ BACK BY AND VERIFIED WITH:     C FORCHA,RN 1552 09/22/2013 WBOND   Glucose, Bld 108 (*) 70 - 99 mg/dL   BUN 59 (*) 6 - 23 mg/dL   Creatinine, Ser 3.08 (*) 0.50 - 1.35 mg/dL   Calcium 6.8 (*) 8.4 - 10.5 mg/dL   Total Protein 3.8 (*) 6.0 - 8.3 g/dL   Albumin 1.8 (*) 3.5 - 5.2 g/dL   AST 200 (*) 0 - 37 U/L   ALT 90 (*) 0 - 53 U/L   Alkaline Phosphatase 84  39 - 117 U/L   Total Bilirubin 4.8 (*) 0.3 - 1.2 mg/dL   GFR calc non Af Amer 19 (*) >90 mL/min   GFR calc Af Amer 23 (*) >90 mL/min   Comment: (NOTE)     The eGFR has been calculated using the CKD EPI equation.     This calculation has not been validated in all clinical situations.     eGFR's persistently <90 mL/min  signify possible Chronic Kidney     Disease.   Anion gap 27 (*) 5 - 15  MAGNESIUM     Status: None   Collection Time    10/01/2013  1:43 PM      Result Value Ref Range   Magnesium 2.3  1.5 - 2.5 mg/dL  PHOSPHORUS     Status: Abnormal   Collection Time    09/28/2013  1:43 PM      Result Value Ref Range   Phosphorus 6.3 (*) 2.3 - 4.6 mg/dL  AMYLASE     Status: Abnormal   Collection Time    09/16/2013  1:43 PM      Result Value Ref Range   Amylase 182 (*) 0 - 105 U/L  LIPASE, BLOOD     Status: None   Collection Time    09/17/2013  1:43 PM      Result Value Ref Range   Lipase 30  11 - 59 U/L  TROPONIN I     Status: Abnormal   Collection Time    09/13/2013  1:43 PM      Result Value Ref Range   Troponin I 1.90 (*) <0.30 ng/mL   Comment:            Due to the release kinetics of cTnI,     a negative result within the first hours      of the onset of symptoms does not rule out     myocardial infarction with certainty.     If myocardial infarction is still suspected,     repeat the test at appropriate intervals.     CRITICAL RESULT CALLED TO, READ BACK BY AND VERIFIED WITH:     C ROYLE,RN 1549 09/10/2013 WBOND  LACTIC ACID, PLASMA     Status: Abnormal   Collection Time    10/06/2013  1:43 PM      Result Value Ref Range   Lactic Acid, Venous 11.1 (*) 0.5 - 2.2 mmol/L  PROCALCITONIN     Status: None   Collection Time    09/16/2013  1:43 PM      Result Value Ref Range   Procalcitonin 16.46     Comment:            Interpretation:     PCT >= 10 ng/mL:     Important systemic inflammatory response,     almost exclusively due to severe bacterial     sepsis or septic shock.     (NOTE)             ICU PCT Algorithm               Non ICU PCT Algorithm        ----------------------------     ------------------------------             PCT < 0.25 ng/mL                 PCT < 0.1 ng/mL         Stopping of antibiotics            Stopping of antibiotics           strongly encouraged.               strongly encouraged.        ----------------------------     ------------------------------           PCT level decrease by  PCT < 0.25 ng/mL           >= 80% from peak PCT           OR PCT 0.25 - 0.5 ng/mL          Stopping of antibiotics                                                 encouraged.         Stopping of antibiotics               encouraged.        ----------------------------     ------------------------------           PCT level decrease by              PCT >= 0.25 ng/mL           < 80% from peak PCT            AND PCT >= 0.5 ng/mL            Continuing antibiotics                                                  encouraged.           Continuing antibiotics                encouraged.        ----------------------------     ------------------------------         PCT level increase compared          PCT > 0.5  ng/mL             with peak PCT AND              PCT >= 0.5 ng/mL             Escalation of antibiotics                                              strongly encouraged.          Escalation of antibiotics            strongly encouraged.  PRO B NATRIURETIC PEPTIDE     Status: Abnormal   Collection Time    09/24/2013  1:43 PM      Result Value Ref Range   Pro B Natriuretic peptide (BNP) 29878.0 (*) 0 - 125 pg/mL  CBC     Status: Abnormal   Collection Time    09/24/2013  1:43 PM      Result Value Ref Range   WBC 16.2 (*) 4.0 - 10.5 K/uL   RBC 5.18  4.22 - 5.81 MIL/uL   Hemoglobin 16.2  13.0 - 17.0 g/dL   HCT 47.2  39.0 - 52.0 %   MCV 91.1  78.0 - 100.0 fL   MCH 31.3  26.0 - 34.0 pg   MCHC 34.3  30.0 - 36.0 g/dL   RDW 14.2  11.5 - 15.5 %   Platelets 65 (*) 150 - 400 K/uL  Comment: REPEATED TO VERIFY     SPECIMEN CHECKED FOR CLOTS     PLATELET COUNT CONFIRMED BY SMEAR  PROTIME-INR     Status: Abnormal   Collection Time    09/21/2013  1:43 PM      Result Value Ref Range   Prothrombin Time 16.0 (*) 11.6 - 15.2 seconds   INR 1.28  0.00 - 1.49  TSH     Status: None   Collection Time    10/04/2013  2:00 PM      Result Value Ref Range   TSH 1.690  0.350 - 4.500 uIU/mL  BLOOD GAS, ARTERIAL     Status: Abnormal   Collection Time    10/05/2013  2:25 PM      Result Value Ref Range   FIO2 0.50     Delivery systems VENTILATOR     Mode PRESSURE REGULATED VOLUME CONTROL     VT 620     Rate 16     Peep/cpap 5.0     pH, Arterial 7.072 (*) 7.350 - 7.450   Comment: CRITICAL RESULT CALLED TO, READ BACK BY AND VERIFIED WITH:     KATHRYN WHITEHEART,NP AT 1439     CRITICAL RESULT CALLED TO, READ BACK BY AND VERIFIED WITH:     KATHERYN WHITEHEART,NP AT 1439,BY SHERRY BREWER RRT,RCP ON 09/16/2013   pCO2 arterial 38.9  35.0 - 45.0 mmHg   pO2, Arterial 73.1 (*) 80.0 - 100.0 mmHg   Bicarbonate 10.8 (*) 20.0 - 24.0 mEq/L   TCO2 12.0  0 - 100 mmol/L   Acid-base deficit 17.3 (*) 0.0 - 2.0 mmol/L   O2  Saturation 90.4     Patient temperature 98.6     Collection site A-LINE     Drawn by [phone]     Sample type ARTERIAL DRAW     Allens test (pass/fail) PASS  PASS  DIGOXIN LEVEL     Status: None   Collection Time    09/21/2013  2:30 PM      Result Value Ref Range   Digoxin Level 1.6  0.8 - 2.0 ng/mL  GLUCOSE, CAPILLARY     Status: None   Collection Time    09/11/2013  4:02 PM      Result Value Ref Range   Glucose-Capillary 99  70 - 99 mg/dL    US Renal Port  10/08/2013   CLINICAL DATA:  Hematuria.  Oliguria.  Renal insufficiency.  EXAM: RENAL/URINARY TRACT ULTRASOUND COMPLETE  COMPARISON:  CT on 09/26/2013  FINDINGS: Right Kidney:  Length: 9.8 cm. Echogenicity within normal limits. No mass or hydronephrosis visualized.  Left Kidney:  Length: 11.6 cm. Echogenicity within normal limits. No mass or hydronephrosis visualized.  Bladder:  Nearly empty with Foley catheter seen in place.  IMPRESSION: Normal sonographic appearance of both kidneys. No evidence hydronephrosis.   Electronically Signed   By: Earle Gell M.D.   On: 09/26/2013 15:58   Dg Chest Port 1 View  10/01/2013   CLINICAL DATA:  Respiratory failure.  EXAM: PORTABLE CHEST - 1 VIEW  COMPARISON:  09/26/2013.  FINDINGS: Endotracheal tube, left IJ line, NG tube in stable position. Low lung volumes with basilar atelectasis. Mild basilar infiltrates cannot be excluded. Small left pleural effusion cannot be excluded. Cardiomegaly. No prominent pulmonary venous congestion. No acute osseous abnormality.  IMPRESSION: 1. Line and tube stable in position. 2. Low lung volumes with basilar atelectasis and/or mild infiltrates. Small left pleural effusion cannot be excluded. 3. Cardiomegaly.  No pulmonary venous distention.   Electronically Signed   By: Marcello Moores  Register   On: 10/05/2013 14:20    Review of Systems  Unable to perform ROS  Pulse 84, temperature 98.2 F (36.8 C), temperature source Oral, resp. rate 16, height 5' 11" (1.803 m), weight 223  lb 12.3 oz (101.5 kg), SpO2 90.00%. Physical Exam  Cardiovascular: Normal rate, regular rhythm, normal heart sounds and intact distal pulses.  Exam reveals no gallop and no friction rub.   No murmur heard. Respiratory:  Clear bilaterally  GI: Soft. Bowel sounds are normal. He exhibits no distension and no mass. There is no tenderness. There is no rebound and no guarding.  There is no evidence of an acute abdomen, abdomen is soft  Skin: He is not diaphoretic.  BLEs are cool, mottling noted     Assessment/Plan: VDRF Hypotension AKI N/V/abdominal pain Leukocytosis Thrombocytopenia   At present time, he has positive bowel sounds and abdomen is soft and without guarding.  No acute abdomen and therefore no indications urgent surgical intervention. Further management per primary team.  Surgery will continue to follow.  Thank you for the consult.    Celeste Tavenner ANP-BC 10/10/2013, 4:18 PM

## 2013-09-29 NOTE — Progress Notes (Signed)
Critical vlaue of C02 of 9 called to Dr Delton CoombesByrum. Also discussed  w Dr Delton CoombesByrum benefits and risks of not going down for Head CT. Dr Delton CoombesByrum order to hold head CT for now

## 2013-09-29 NOTE — Consult Note (Signed)
Attending:  I examined the patient.  He is critically ill in renal failure, hepatic failure, vent-dependent respiratory failure, thrombocytopenic, on maximal pressor support with severe metabolic acidosis and elevated troponin NSTEMI.  His abdomen seems soft with no palpable masses.  Ischemia of the bowel would cause some rigidity or guarding, even in a sedated patient.  Overwhelming sepsis of unclear etiology.  The patient likely would not survive an exploratory laparotomy at this time.  We will continue to follow.    Wilmon ArmsMatthew K. Corliss Skainssuei, MD, [facility]  General/ Trauma Surgery  09/18/2013 4:51 PM

## 2013-09-29 NOTE — Procedures (Signed)
Arterial Catheter Insertion Procedure Note Jared Ingram [id number] 1945/02/12  Procedure: Insertion of Arterial Catheter  Indications: Blood pressure monitoring and Frequent blood sampling  Procedure Details Consent: Unable to obtain consent because of emergent medical necessity. Time Out: Verified patient identification, verified procedure, site/side was marked, verified correct patient position, special equipment/implants available, medications/allergies/relevent history reviewed, required imaging and test results available.  Performed  Maximum sterile technique was used including antiseptics, cap, gloves, gown, hand hygiene, mask and sheet. Skin prep: Chlorhexidine; local anesthetic administered 20 gauge catheter was inserted into right radial artery using the Seldinger technique.  Evaluation Blood flow good; BP tracing good. Complications: No apparent complications.  Jared Ingram,Jared Ingram 10/10/2013

## 2013-09-29 NOTE — Progress Notes (Signed)
ANTIBIOTIC CONSULT NOTE - INITIAL  Pharmacy Consult for Vancomycin, Zosyn Indication: rule out sepsis  Allergies  Allergen Reactions  . Sulfa Antibiotics     Patient Measurements:  Weight 101.5 kg per RN Height -   Vital Signs:   Intake/Output from previous day:   Intake/Output from this shift:    Labs: [facility] - in paper chart. In-patient labs pending.   Microbiology: 8/20 BCx >> 8/20 UCx >> 8/20 RespCx >> 8/20 Strep UA >> 8/20 Legionella UA >>  [facility] (obtained from paper chart): Cdiff NEG Stool cx- ngtd  Medical History: GERD, HTN, Appendectomy, Cholecystectomy - from CCM Progress note.   Medications:  Levaquin PTA   Assessment: [age] [facility] with hx of nausea, vomiting, hematuria, abdominal & back pain. [facility] after 4 day stay for shock. Patient with AKI with SCr [facility] 1.8 >> 2.4. Presumed sepsis with possible source lung vs GI.   Goal of Therapy:  Vancomycin trough level 15-20 mcg/ml  Plan:  1. Vancomycin 1750mg  IV x1 now. 2. Zosyn 3.375g IV x1 over 30 minutes now.  3. Follow-up current pending labs to determine further dosing.   Link SnufferJessica Aydan Phoenix, PharmD, BCPS Clinical Pharmacist [phone] 29-Sep-2013,2:00 PM

## 2013-09-29 NOTE — Consult Note (Signed)
    [facility]     Reason for Consult: septic shock    Referring Physician: Dr. Molli KnockYacoub  Active Problems:   Respiratory failure   Septic shock   Acute renal failure   Shock liver   Altered mental status   . doxycycline (VIBRAMYCIN) IV  100 mg Intravenous Q12H  . hydrocortisone sod succinate (SOLU-CORTEF) inj  100 mg Intravenous Q6H  . imipenem-cilastatin  500 mg Intravenous STAT  . pantoprazole (PROTONIX) IV  40 mg Intravenous QHS  . vancomycin  1,750 mg Intravenous Once    Recommendations: Continue vancomycin I will change zosyn to imipenem for improved CNS penetration Added doxycycline for chance of RMSF Await cultures, scans  Assessment: He has septic shock requiring multiple pressors for support, unresponsive, no positive cultures.  I suspect overwhelming infection such as Staph aureus, possible RMSF.    Antibiotics: Vancomycin, zosyn levaquin as outpt  HPI: Jared Ingram is a 68 y.o. male with several days of n/v, adb and back pain, hematuria [facility]  Started on empiric antibiotics.  Noted worseing, bandemia with normal WBC and significant decline over last 24 hours.  Currently he is unresponsive on multiple pressors, no sedation.  History otherwise unobtainable.    Review of Systems: A comprehensive review of systems was negative.  Past Medical History  Diagnosis Date  . GERD (gastroesophageal reflux disease)   . Essential hypertension   . Herniated disc   . Hyperlipidemia     History  Substance Use Topics  . Smoking status: Never Smoker   . Smokeless tobacco: Never Used  . Alcohol Use: No    No family history on file. Allergies  Allergen Reactions  . Sulfa Antibiotics     OBJECTIVE: Pulse 84, resp. rate 16, height 5\' 11"  (1.803 m), weight 223 lb 12.3 oz (101.5 kg), SpO2 90.00%. General: intubated, not responsive Skin: no rashes, cool extremities Lungs: CTA B, anterior exam Cor: Tachy RR without  m Abdomen: obese, soft, no distension Ext: no edema Neuro: unresponsive HEENT: pinpoint pupils  Microbiology: No results found for this or any previous visit (from the past 240 hour(s)).  Jared Ingram, Jared Dejarnette, MD [facility] [email or web address] [phone] pager  [phone] 09/18/2013, 3:24 PM

## 2013-09-29 NOTE — Consult Note (Signed)
Reason for Consult: AKI with anuria Referring Physician: Dr. Levester Fresh is an 68 y.o. male.  HPI: Mr. Deans is a 68 year old male with a PMH of HTN who presented to his PCP on 08/17 with N/V, hematuria and back pain.  UA at PCP office revealed blood with 3-5 RBCs.  He was also noted to have abdominal pain with guarding.  He was admitted to [facility] on the dame day.  CT with contrast was obtained which did not uncover the etiology of his symptoms.  He had itractable N/V, was treated with abx and anti-emetic but continued to decline, requiring intubation, pressor support and transfer to [facility]  Nephrology was consulted because the patient has developed AKI with no UOP.  Home medications at admission to [facility] were: Levofloxacin 500po daily (unclear when prescribed) Losartan 12.50m daily Protonix 228mqHS Prednisone 1027mo with taper Promethazine 59m60m q8h prn  Trend in Creatinine: Labs from outside hospital: 08/17 - 1.30; BUN 31, GFR 55 08/19 - 1.80 08/20 - 2.40 Lab Results  Component Value Date   CREATININE 3.08* 09/14/2013   PMH:   Past Medical History  Diagnosis Date  . GERD (gastroesophageal reflux disease)   . Essential hypertension   . Herniated disc   . Hyperlipidemia     PSH:   Past Surgical History  Procedure Laterality Date  . Appendectomy    . Cholecystectomy      Allergies:  Allergies  Allergen Reactions  . Sulfa Antibiotics     Medications:   Prior to Admission medications   Medication Sig Start Date End Date Taking? Authorizing Provider  levofloxacin (LEVAQUIN) 500 MG tablet Take 500 mg by mouth 2 (two) times daily. 09/24/13  Yes Historical Provider, MD  losartan (COZAAR) 25 MG tablet Take 12.5 mg by mouth daily. 08/09/13  Yes Historical Provider, MD  pantoprazole (PROTONIX) 40 MG tablet Take 40 mg by mouth daily. 09/23/13   Historical Provider, MD  pravastatin (PRAVACHOL) 20 MG tablet Take 20 mg by mouth daily. 08/31/13  Yes Historical  Provider, MD  predniSONE (DELTASONE) 10 MG tablet Take 10 mg by mouth daily. 09/24/13  Yes Historical Provider, MD    Inpatient medications: . hydrocortisone sod succinate (SOLU-CORTEF) inj  100 mg Intravenous Q6H  . pantoprazole (PROTONIX) IV  40 mg Intravenous QHS  . piperacillin-tazobactam  3.375 g Intravenous NOW  . vancomycin  1,750 mg Intravenous Once    Discontinued Meds:   Medications Discontinued During This Encounter  Medication Reason  . norepinephrine (LEVOPHED) 4 mg in dextrose 5 % 250 mL infusion   . amiodarone (NEXTERONE PREMIX) 360 MG/200ML (1.8 mg/mL) IV infusion   . amiodarone (NEXTERONE PREMIX) 360 MG/200ML (1.8 mg/mL) IV infusion     Social History:  reports that he has never smoked. He has never used smokeless tobacco. He reports that he does not drink alcohol or use illicit drugs.  Family History:  No family history on file.  Review of systems not obtained due to patient factors.  Mr. ParrElsayedsedated on vent and therefore unable to provide history or ROS. Weight change:  No intake or output data in the 24 hours ending 09/12/2013 1500 Pulse 84  Resp 16  SpO2 90% Filed Vitals:   10/03/2013 1325  Pulse: 84  Resp: 16  SpO2: 90%     HEENT: intubated  Cardiac: RRR, no rubs, murmurs or gallops Pulm: clear to auscultation bilaterally, moving normal volumes of air Abd: soft, nontender (sedate so  difficult to assess), nondistended, hypoactive BS  GU:  Very small amount of dark urine in Foley bag Ext: +1 lower extremity edema, B/L radial and DPs palpable Neuro: sedated and intubated on vent  Labs: Basic Metabolic Panel:  Recent Labs Lab 09/17/2013 1343  NA 138  K 4.5  CL 101  CO2 10*  GLUCOSE 108*  BUN 59*  CREATININE 3.08*  ALBUMIN 1.8*  CALCIUM 6.8*  PHOS 6.3*   Liver Function Tests:  Recent Labs Lab 09/28/2013 1343  AST 200*  ALT 90*  ALKPHOS 84  BILITOT 4.8*  PROT 3.8*  ALBUMIN 1.8*    Recent Labs Lab 09/24/2013 1343  LIPASE 30   AMYLASE 182*   CBC:  Recent Labs Lab 10/10/2013 1343  WBC 16.2*  HGB 16.2  HCT 47.2  MCV 91.1  PLT PENDING   PT/INR:  Cardiac Enzymes:    Elevated to 2.01 at [facility]  Recent Labs Lab 09/19/2013 1343  TROPONINI 1.90*   CBG:  Recent Labs Lab 09/14/2013 1602  GLUCAP 99    Xrays/Other Studies: Dg Chest Port 1 View  09/21/2013   CLINICAL DATA:  Respiratory failure.  EXAM: PORTABLE CHEST - 1 VIEW  COMPARISON:  09/18/2013.  FINDINGS: Endotracheal tube, left IJ line, NG tube in stable position. Low lung volumes with basilar atelectasis. Mild basilar infiltrates cannot be excluded. Small left pleural effusion cannot be excluded. Cardiomegaly. No prominent pulmonary venous congestion. No acute osseous abnormality.  IMPRESSION: 1. Line and tube stable in position. 2. Low lung volumes with basilar atelectasis and/or mild infiltrates. Small left pleural effusion cannot be excluded. 3. Cardiomegaly.  No pulmonary venous distention.   Electronically Signed   By: Marcello Moores  Register   On: 10/03/2013 14:20    Assessment/Plan: 1. Oliguric AKI secondary to volume depletion (N/V) in the setting of ARB use, hypotension and contrast induced nephropathy.   - Will need continued hydration and avoidance of nephrotoxins.   - UA/culture and Renal US pending.   - Because of severe acidosis, poor UOP and hemodynamic instability, will initiate CVVHD and continue to monitor renal function with daily RFP and UOP. 2. High AG metabolic acidosis - pH 6.295, AG 27, bicarb 10.  2/2 to lactic acidosis (lactic acid 11.1). Continue bicarb gtt, give post-filter with CVVHD. 3. Septic shock - management by PCCM including EGDT 4. Acute respiratory failure - vent management per PCCM 5. NSTEMI - management per PCCM and cardiology  Duwaine Maxin, DO IMTS, PGY2 [phone] 09/17/2013, 3:00 PM   I have seen and examined this patient and agree with plan per Dr Redmond Pulling.  68yo WM presented to [facility] with N/V and  quickly deteriorated with hypotension and VDRF.  Had contrasted CT scans done and was on losartan.  Now, essentially anuric requiring 3 pressors with marked metabolic acidosis.  He is receiving significant amt of IV fluids and CVP 14.  Presumably ARF (baseline unknown) due to hypotension and IV contrast in face of ARB.  Due to significant volume requirements, anuria and severe met acidosis, will start CVVHD.Marland Kitchen Opie Maclaughlin T,MD 09/17/2013 4:48 PM

## 2013-09-30 ENCOUNTER — Inpatient Hospital Stay (HOSPITAL_COMMUNITY): Payer: Medicare HMO

## 2013-09-30 DIAGNOSIS — R579 Shock, unspecified: Secondary | ICD-10-CM

## 2013-09-30 DIAGNOSIS — R6521 Severe sepsis with septic shock: Secondary | ICD-10-CM

## 2013-09-30 DIAGNOSIS — A419 Sepsis, unspecified organism: Principal | ICD-10-CM

## 2013-09-30 DIAGNOSIS — R6520 Severe sepsis without septic shock: Secondary | ICD-10-CM

## 2013-09-30 DIAGNOSIS — N179 Acute kidney failure, unspecified: Secondary | ICD-10-CM

## 2013-09-30 DIAGNOSIS — D696 Thrombocytopenia, unspecified: Secondary | ICD-10-CM

## 2013-09-30 LAB — CBC WITH DIFFERENTIAL/PLATELET
BAND NEUTROPHILS: 0 % (ref 0–10)
BLASTS: 0 %
Basophils Absolute: 0 10*3/uL (ref 0.0–0.1)
Basophils Relative: 0 % (ref 0–1)
Eosinophils Absolute: 0 10*3/uL (ref 0.0–0.7)
Eosinophils Relative: 0 % (ref 0–5)
HEMATOCRIT: 52.6 % — AB (ref 39.0–52.0)
Hemoglobin: 17.8 g/dL — ABNORMAL HIGH (ref 13.0–17.0)
Lymphocytes Relative: 2 % — ABNORMAL LOW (ref 12–46)
Lymphs Abs: 0.5 10*3/uL — ABNORMAL LOW (ref 0.7–4.0)
MCH: 31.3 pg (ref 26.0–34.0)
MCHC: 33.8 g/dL (ref 30.0–36.0)
MCV: 92.4 fL (ref 78.0–100.0)
MYELOCYTES: 0 %
Metamyelocytes Relative: 0 %
Monocytes Absolute: 0.8 10*3/uL (ref 0.1–1.0)
Monocytes Relative: 3 % (ref 3–12)
NRBC: 1 /100{WBCs} — AB
Neutro Abs: 23.8 10*3/uL — ABNORMAL HIGH (ref 1.7–7.7)
Neutrophils Relative %: 95 % — ABNORMAL HIGH (ref 43–77)
PLATELETS: 81 10*3/uL — AB (ref 150–400)
Promyelocytes Absolute: 0 %
RBC: 5.69 MIL/uL (ref 4.22–5.81)
RDW: 14.5 % (ref 11.5–15.5)
WBC: 25.1 10*3/uL — ABNORMAL HIGH (ref 4.0–10.5)

## 2013-09-30 LAB — BLOOD GAS, ARTERIAL
ACID-BASE DEFICIT: 19.5 mmol/L — AB (ref 0.0–2.0)
BICARBONATE: 8.1 meq/L — AB (ref 20.0–24.0)
Drawn by: 31101
FIO2: 0.5 %
MECHVT: 620 mL
O2 Saturation: 92.9 %
PCO2 ART: 26.6 mmHg — AB (ref 35.0–45.0)
PEEP: 5 cmH2O
PH ART: 7.112 — AB (ref 7.350–7.450)
Patient temperature: 98.6
RATE: 16 resp/min
TCO2: 9 mmol/L (ref 0–100)
pO2, Arterial: 82.4 mmHg (ref 80.0–100.0)

## 2013-09-30 LAB — URINE CULTURE
Colony Count: NO GROWTH
Culture: NO GROWTH

## 2013-09-30 LAB — RENAL FUNCTION PANEL
ANION GAP: 36 — AB (ref 5–15)
Albumin: 1.5 g/dL — ABNORMAL LOW (ref 3.5–5.2)
BUN: 49 mg/dL — ABNORMAL HIGH (ref 6–23)
CHLORIDE: 92 meq/L — AB (ref 96–112)
CO2: 7 mEq/L — CL (ref 19–32)
Calcium: 6.1 mg/dL — CL (ref 8.4–10.5)
Creatinine, Ser: 2.97 mg/dL — ABNORMAL HIGH (ref 0.50–1.35)
GFR, EST AFRICAN AMERICAN: 24 mL/min — AB (ref >90)
GFR, EST NON AFRICAN AMERICAN: 20 mL/min — AB (ref >90)
Glucose, Bld: 151 mg/dL — ABNORMAL HIGH (ref 70–99)
POTASSIUM: 4.6 meq/L (ref 3.7–5.3)
Phosphorus: 8.2 mg/dL — ABNORMAL HIGH (ref 2.3–4.6)
Sodium: 135 mEq/L — ABNORMAL LOW (ref 137–147)

## 2013-09-30 LAB — POCT I-STAT 3, ART BLOOD GAS (G3+)
ACID-BASE DEFICIT: 18 mmol/L — AB (ref 0.0–2.0)
BICARBONATE: 8.6 meq/L — AB (ref 20.0–24.0)
O2 Saturation: 89 %
PCO2 ART: 24.1 mmHg — AB (ref 35.0–45.0)
PO2 ART: 69 mmHg — AB (ref 80.0–100.0)
Patient temperature: 97.6
TCO2: 9 mmol/L (ref 0–100)
pH, Arterial: 7.156 — CL (ref 7.350–7.450)

## 2013-09-30 LAB — LEGIONELLA ANTIGEN, URINE: LEGIONELLA ANTIGEN, URINE: NEGATIVE

## 2013-09-30 LAB — PATHOLOGIST SMEAR REVIEW

## 2013-09-30 LAB — GLUCOSE, CAPILLARY
GLUCOSE-CAPILLARY: 96 mg/dL (ref 70–99)
Glucose-Capillary: 140 mg/dL — ABNORMAL HIGH (ref 70–99)
Glucose-Capillary: 155 mg/dL — ABNORMAL HIGH (ref 70–99)

## 2013-09-30 LAB — PROTIME-INR
INR: 1.29 (ref 0.00–1.49)
Prothrombin Time: 16.1 seconds — ABNORMAL HIGH (ref 11.6–15.2)

## 2013-09-30 LAB — CK TOTAL AND CKMB (NOT AT ARMC)
CK, MB: 158.9 ng/mL — AB (ref 0.3–4.0)
RELATIVE INDEX: 2.9 — AB (ref 0.0–2.5)
Total CK: 5565 U/L — ABNORMAL HIGH (ref 7–232)

## 2013-09-30 LAB — TROPONIN I: TROPONIN I: 2.9 ng/mL — AB (ref <0.30)

## 2013-09-30 LAB — LACTIC ACID, PLASMA
LACTIC ACID, VENOUS: 19 mmol/L — AB (ref 0.5–2.2)
Lactic Acid, Venous: 13.5 mmol/L — ABNORMAL HIGH (ref 0.5–2.2)

## 2013-09-30 LAB — CBC
HEMATOCRIT: 49.6 % (ref 39.0–52.0)
Hemoglobin: 16.9 g/dL (ref 13.0–17.0)
MCH: 31.3 pg (ref 26.0–34.0)
MCHC: 34.1 g/dL (ref 30.0–36.0)
MCV: 91.9 fL (ref 78.0–100.0)
PLATELETS: 84 10*3/uL — AB (ref 150–400)
RBC: 5.4 MIL/uL (ref 4.22–5.81)
RDW: 14.6 % (ref 11.5–15.5)
WBC: 26 10*3/uL — AB (ref 4.0–10.5)

## 2013-09-30 LAB — MAGNESIUM: MAGNESIUM: 2.2 mg/dL (ref 1.5–2.5)

## 2013-09-30 LAB — LIPASE, BLOOD: Lipase: 39 U/L (ref 11–59)

## 2013-09-30 LAB — ROCKY MTN SPOTTED FVR AB, IGM-BLOOD: RMSF IgM: 0.37 IV (ref 0.00–0.89)

## 2013-09-30 LAB — CORTISOL: CORTISOL PLASMA: 70.8 ug/dL

## 2013-09-30 LAB — ROCKY MTN SPOTTED FVR AB, IGG-BLOOD: RMSF IgG: 0.92 IV — ABNORMAL HIGH

## 2013-09-30 MED ORDER — SODIUM BICARBONATE 8.4 % IV SOLN
INTRAVENOUS | Status: DC
  Administered 2013-09-30: 03:00:00 via INTRAVENOUS
  Filled 2013-09-30 (×3): qty 150

## 2013-09-30 MED ORDER — SODIUM CHLORIDE 0.9 % IV BOLUS (SEPSIS)
1000.0000 mL | Freq: Once | INTRAVENOUS | Status: AC
  Administered 2013-09-30: 1000 mL via INTRAVENOUS

## 2013-09-30 MED ORDER — VANCOMYCIN 50 MG/ML ORAL SOLUTION
500.0000 mg | Freq: Four times a day (QID) | ORAL | Status: DC
  Administered 2013-09-30: 500 mg via ORAL
  Filled 2013-09-30 (×5): qty 10

## 2013-09-30 MED ORDER — SODIUM BICARBONATE 8.4 % IV SOLN
100.0000 meq | Freq: Once | INTRAVENOUS | Status: AC
  Administered 2013-09-30: 100 meq via INTRAVENOUS
  Filled 2013-09-30: qty 50

## 2013-09-30 MED ORDER — SODIUM CHLORIDE 0.9 % IV SOLN
25.0000 ug/h | INTRAVENOUS | Status: DC
  Administered 2013-09-30: 200 ug/h via INTRAVENOUS
  Filled 2013-09-30: qty 50

## 2013-09-30 MED ORDER — DEXTROSE 5 % IV SOLN
0.5000 ug/min | INTRAVENOUS | Status: DC
  Administered 2013-09-30: 20 ug/min via INTRAVENOUS
  Filled 2013-09-30 (×2): qty 4

## 2013-09-30 MED ORDER — STERILE WATER FOR INJECTION IV SOLN
INTRAVENOUS | Status: DC
  Administered 2013-09-30 (×2): via INTRAVENOUS_CENTRAL
  Filled 2013-09-30 (×4): qty 150

## 2013-09-30 MED ORDER — METRONIDAZOLE IN NACL 5-0.79 MG/ML-% IV SOLN
500.0000 mg | Freq: Three times a day (TID) | INTRAVENOUS | Status: DC
Start: 2013-09-30 — End: 2013-09-30
  Administered 2013-09-30: 500 mg via INTRAVENOUS
  Filled 2013-09-30 (×3): qty 100

## 2013-09-30 MED ORDER — DEXTROSE 5 % IV SOLN
0.5000 ug/min | INTRAVENOUS | Status: DC
  Administered 2013-09-30: 10 ug/min via INTRAVENOUS
  Filled 2013-09-30: qty 1

## 2013-10-05 LAB — CULTURE, BLOOD (ROUTINE X 2)
Culture: NO GROWTH
Culture: NO GROWTH

## 2013-10-11 NOTE — Progress Notes (Signed)
Chaplain provided emotional and spiritual support to family of critically ill patient in ICU. Family said this illness was completely unexpected, as patient appeared well and healthy several days ago. Pt does not have a church home, but was raised [employer]. Pt's wife and sons identified their "large family" as being their Programmer, multimediabiggest resource. They described pt as a man with "a green thumb," who loved to garden and work on his classic Ford truck. Pt has two grandchildren. Chaplain provided emotional and spiritual support through pastoral presence, prayer, hospitality, and empathetic listening to their concerns. Available if needed, and will refer for follow up in the AM.   Jared CapesHillary D Ingram [phone]

## 2013-10-11 NOTE — Progress Notes (Signed)
Patient ID: Jared Ingram, male   DOB: 31-Aug-1945, 68 y.o.   MRN: [id number]  I have seen the note from Critical Care Medicine - patient not responding to treatment.  Worsening.  No surgical indications.  Patient likely will not survive.  Wilmon ArmsMatthew K. Corliss Skainssuei, MD, [facility]  General/ Trauma Surgery  09/19/2013 8:43 AM

## 2013-10-11 NOTE — Progress Notes (Signed)
Pt deceased. Ventilator turned off at this time.

## 2013-10-11 NOTE — Progress Notes (Signed)
eLink Physician-Brief Progress Note Patient Name: Tonny Branchaul Sawin DOB: 1945-07-26 MRN: [id number]   Date of Service  09/12/2013  HPI/Events of Note  Lab review shows worsening acidemia, lactic acidosis, rhabdo CK 5500  Patient now worsening shock - refractory  Family at bedside: RN says they insist on full code    eICU Interventions  Add epi gtt Start another bicarb gtt Stat chaplain consult  This patient will not survive     Intervention Category Major Interventions: Acid-Base disturbance - evaluation and management;Electrolyte abnormality - evaluation and management;Respiratory failure - evaluation and management;Sepsis - evaluation and management;Shock - evaluation and management;Other:  Hudsyn Barich 09/23/2013, 2:32 AM

## 2013-10-11 NOTE — Discharge Summary (Signed)
NAME:  JERRARD, BRADBURN NO.:  [id number]  MEDICAL RECORD NO.:  [id number]  LOCATION:  2M06C                        FACILITY:  [facility]  PHYSICIAN:  Veto Kemps, MDDATE OF BIRTH:  23-Jul-1945  DATE OF ADMISSION:  09/25/2013 DATE OF DISCHARGE:  10-03-2013                              DISCHARGE SUMMARY   DEATH SUMMARY:  DATE OF DEATH:  Oct 03, 2013.  CAUSE OF DEATH: 1. Septic shock. 2. Acute kidney injury. 3. Acute respiratory failure.  HISTORY OF PRESENT ILLNESS:  This is a 68 year old male, who was admitted to [facility] on August 20,2015, after he had presented to his primary care physician on September 26, 2013, with 3 days of nausea, vomiting, back pain, and hematuria.  He was admitted to [facility].  He had a CT abdomen, which was negative and he was treated with antiemetics and antibiotics, but his condition continued to decline.  He had NSTEMI at [facility] and developed acute kidney injury, so he was transferred to [facility].  Prior to transfer, he had developed acute respiratory failure and septic shock and was placed on mechanical ventilation for transport.  PAST MEDICAL HISTORY:  Please see the admission H and P.  FAMILY HISTORY:  Please see the admission H and P.  SOCIAL HISTORY:  Please see the admission H and P.  PHYSICAL EXAMINATION:  GENERAL:  On admission to [facility] sedated on vent paralyzed. NEURO:  Sedated on vent. HEENT:  Pupils equal, round, and reactive to light. NECK:  No JVD. CARDIOVASCULAR:  Regular rate and rhythm on amiodarone drip. LUNGS:  Clear to auscultation bilaterally. ABDOMEN:  Soft.  Bowel sounds are positive. MUSCULOSKELETAL:  Normal bulk and tone. SKIN:  Cool.  No edema 2+.  Toes are cold and mottled.  HOSPITAL COURSE:  The patient was admitted to the [facility] for further evaluation of severe septic shock.  The patient was managed on aggressive vaso support  with vasopressors in our ICU as well as broad- spectrum antibiotics.  Infectious Disease, General Surgery, and Nephrology were all consulted for assistance.  There was no clear source of sepsis identified after careful review of the CT abdomen images from the outside hospital.  His working diagnosis included an abdominal catastrophe versus possibly rickettsial illness.  He was treated aggressively with continuous venovenous hemodialysis in addition to mechanical ventilation and vasopressors. However, despite aggressive support his condition continued to rapidly decline and he passed on 03-Oct-2013, after we discussed the severity of his illness with his family.  They agreed to a no code status shortly before his death, and therefore, CPR was not performed. He died with his family at the bedside.          ______________________________ Veto Kemps, MD     DBM/MEDQ  D:  10/10/2013  T:  10/11/2013  Job:  [id number]

## 2013-10-11 NOTE — H&P (Signed)
PULMONARY / CRITICAL CARE MEDICINE   Name: Jared Ingram MRN: [id number] DOB: 01/27/1946    ADMISSION DATE:  09/28/2013   REFERRING MD :  Duke Salviaandolph CO. MD  CHIEF COMPLAINT:  Shock  INITIAL PRESENTATION: Nausea, vomiting, hematuria, abd/back pain 8/17  STUDIES:  8/18 ct abd reported negative  SIGNIFICANT EVENTS: 8/20 transported to [facility] 8/20 surgery consult 8/20 echo > RV normal size and function, LAE noted, poor LV windows 8/21 refractory shock, worsening acidosis  SUBJECTIVE: refractory shock, worsening acidosis; seen by surgery, cannot operate right now  VITAL SIGNS: Temp:  [97.6 F (36.4 C)-98.3 F (36.8 C)] 98.3 F (36.8 C) (08/21 0003) Pulse Rate:  [36-100] 100 (08/21 0115) Resp:  [16-37] 19 (08/21 0115) BP: (53-132)/(30-92) 94/75 mmHg (08/21 0018) SpO2:  [68 %-100 %] 89 % (08/21 0115) Arterial Line BP: (77-122)/(43-63) 99/54 mmHg (08/21 0115) FiO2 (%):  [50 %] 50 % (08/20 2307) Weight:  [101.5 kg (223 lb 12.3 oz)] 101.5 kg (223 lb 12.3 oz) (08/20 1445) HEMODYNAMICS: CVP:  [8 mmHg-15 mmHg] 8 mmHg VENTILATOR SETTINGS: Vent Mode:  [-] PRVC FiO2 (%):  [50 %] 50 % Set Rate:  [16 bmp] 16 bmp Vt Set:  [409[620 mL] 620 mL PEEP:  [5 cmH20] 5 cmH20 Plateau Pressure:  [16 cmH20-25 cmH20] 17 cmH20 INTAKE / OUTPUT:  Intake/Output Summary (Last 24 hours) at Jul 25, 2013 0305 Last data filed at Jul 25, 2013 0200  Gross per 24 hour  Intake 4786.02 ml  Output   1783 ml  Net 3003.02 ml    PHYSICAL EXAMINATION: General:  Intermittently agitated HEENT: NCAT, ETT in place PULM: CTA B CV: Tachy, regular AB: tender, guarding, not rigid Ext: cool, mottled Derm: no clear rash, cyanosis of extremities noted Neuro: agitated on vent, pushes my hand away on abdominal exam, clearly in pain  LABS:  CBC  Recent Labs Lab 09/25/2013 1343 Jul 25, 2013 0053  WBC 16.2* 25.1*  HGB 16.2 17.8*  HCT 47.2 52.6*  PLT 65* 81*   Coag's  Recent Labs Lab 09/10/2013 1343 Jul 25, 2013 0053  INR  1.28 1.29   BMET  Recent Labs Lab 09/19/2013 1343 09/21/2013 1600 09/23/2013 2000  NA 138 137 132*  K 4.5 4.4 4.6  CL 101 100 95*  CO2 10* 9* 9*  BUN 59* 60* 59*  CREATININE 3.08* 3.07* 3.06*  GLUCOSE 108* 161* 196*   Electrolytes  Recent Labs Lab 09/10/2013 1343 09/17/2013 1600 09/10/2013 2000  CALCIUM 6.8* 6.7* 6.5*  MG 2.3  --   --   PHOS 6.3* 6.5*  --    Sepsis Markers  Recent Labs Lab 09/14/2013 1343 Jul 25, 2013 0100  LATICACIDVEN 11.1* 13.5*  PROCALCITON 16.46  --    ABG  Recent Labs Lab 10/10/2013 1425 Jul 25, 2013 0054  PHART 7.072* 7.156*  PCO2ART 38.9 24.1*  PO2ART 73.1* 69.0*   Liver Enzymes  Recent Labs Lab 09/24/2013 1343 10/08/2013 1600  AST 200*  --   ALT 90*  --   ALKPHOS 84  --   BILITOT 4.8*  --   ALBUMIN 1.8* 1.8*   Cardiac Enzymes  Recent Labs Lab 09/12/2013 1343 10/07/2013 1943 Jul 25, 2013 0046  TROPONINI 1.90* 2.90* 2.90*  PROBNP 29878.0*  --   --    Glucose  Recent Labs Lab 09/10/2013 1602 09/11/2013 1937 Jul 25, 2013 0001  GLUCAP 99 158* 140*    Imaging [place] Renal Port  09/10/2013   CLINICAL DATA:  Hematuria.  Oliguria.  Renal insufficiency.  EXAM: RENAL/URINARY TRACT ULTRASOUND COMPLETE  COMPARISON:  CT on 09/26/2013  FINDINGS: Right Kidney:  Length: 9.8 cm. Echogenicity within normal limits. No mass or hydronephrosis visualized.  Left Kidney:  Length: 11.6 cm. Echogenicity within normal limits. No mass or hydronephrosis visualized.  Bladder:  Nearly empty with Foley catheter seen in place.  IMPRESSION: Normal sonographic appearance of both kidneys. No evidence hydronephrosis.   Electronically Signed   By: Myles Rosenthal M.D.   On: 09/17/2013 15:58   Dg Chest Port 1 View  10/06/2013   CLINICAL DATA:  Dialysis catheter placement, history essential hypertension, GERD  EXAM: PORTABLE CHEST - 1 VIEW  COMPARISON:  Portable exam 1653 hr compared to 09/28/2013 at 1402 hr  FINDINGS: Tip of endotracheal tube projects 5.7 cm above carina.  Nasogastric tube extends  into stomach.  LEFT jugular central venous catheter tip projects over distal LEFT brachiocephalic vein.  New RIGHT jugular central venous catheter with tip projecting over proximal SVC.  Normal heart size, mediastinal contours, and pulmonary vascularity.  Atelectasis versus consolidation in LEFT lower lobe unchanged.  Mild RIGHT basilar atelectasis and small pleural effusion.  No pneumothorax.  IMPRESSION: No pneumothorax following central line placement.  Otherwise no change.   Electronically Signed   By: Ulyses Southward M.D.   On: 10/09/2013 17:13   Dg Chest Port 1 View  10/04/2013   CLINICAL DATA:  Respiratory failure.  EXAM: PORTABLE CHEST - 1 VIEW  COMPARISON:  09/23/2013.  FINDINGS: Endotracheal tube, left IJ line, NG tube in stable position. Low lung volumes with basilar atelectasis. Mild basilar infiltrates cannot be excluded. Small left pleural effusion cannot be excluded. Cardiomegaly. No prominent pulmonary venous congestion. No acute osseous abnormality.  IMPRESSION: 1. Line and tube stable in position. 2. Low lung volumes with basilar atelectasis and/or mild infiltrates. Small left pleural effusion cannot be excluded. 3. Cardiomegaly.  No pulmonary venous distention.   Electronically Signed   By: Maisie Fus  Register   On: 10/01/2013 14:20     ASSESSMENT / PLAN:  PULMONARY OETT 8/20>> A: VDRF secondary to shock, acidosis.  P:   Continue full vent support with high RR Fentanyl for vent synchrony  CARDIOVASCULAR CVL left I J 8/20>> A:  Refractory septic shock, rising lactic acid Sinus tachycardia Elevated trop I Presumed sepsis P:  Monitor CVP Add epinephrine to levophed, vasopressin, neo Continue stress steroids. Supportive care  RENAL A:   Hematuria Renal insuff Metabolic acidosis P:   Continue high bicarb CVVHD  GASTROINTESTINAL A:   Nausea and vomiting Peritonitis? Soft abdomen on exam. Not surgical candidate given refractory shock P:   OGT -> lwis NPO f/u C.  Diff.  HEMATOLOGIC A:   Thrombocytopenia from sepsis P:  DVT prophylaxis with scd  INFECTIOUS A:   Septic shock> source uncertain, tick borne illness? colitis? P:   BCx2 8/20>> UC 8/20>> Sputum 8/20>> Abx: vanc, start date 8/20, day 2/x Abx: Pip-tazo, start date 8/20 Abx: Imipenem, start date 8/20 day 2/x Abx: doxycycline, start date 8/20 day 2/x Abx: Flagyl start date 8/21, day 1/x Abx: Oral Vanc start date 8/21, day 1/x   ENDOCRINE A:   Recent steroid use  P:   Stress steroids  NEUROLOGIC A:   Decreased LOC but just placed on sedation for transport to [facility] P:   RASS goal: -1 Wake up assessment to evaluate mental status , may need ct of head in future.   TODAY'S SUMMARY:  Chart reviewed, case discussed with partner.  Agree that we are dealing with refractory septic shock, source still uncertain but tick  borne illness vs colitis vs peritonitis seem most likely.  He would not be able to tolerate surgery.  I have updated the family at length (two sons and wife).  I explained to them that we will press on with full medical support including epinephrine drip, but should he develop cardiac arrest he will not survive.  They voiced understanding and agreed with me to change his code status to limited code blue> full medical support but no CPR or electric shocks.    I have personally obtained a history, examined the patient, evaluated laboratory and imaging results, formulated the assessment and plan and placed orders.  CRITICAL CARE: The patient is critically ill with multiple organ systems failure and requires high complexity decision making for assessment and support, frequent evaluation and titration of therapies, application of advanced monitoring technologies and extensive interpretation of multiple databases. Critical Care Time devoted to patient care services described in this note is 40 minutes.   Heber Old Mystic, MD [place] P[facility]M Pager: [phone] Cell: [phone] If  no response, call [phone]  10-05-2013, 3:05 AM     10-05-13, 3:05 AM

## 2013-10-11 NOTE — Progress Notes (Signed)
Upon assessment this morning the clinical picture was looking very poor. Family had spoken with MD last night and made him DNR. Patient was max on epi, levo., neo, and vasopressin. B/p still remain in the 50s systolic. At about 8:30 patient was in what appears to be agonal rhythm. MD was call to bedside, after talking with family, family decided to stop pressors and CRRT. Patient did not last long after that was done. At [phone] patient was pronounce dead. No pulse, no bp. No heart sound upon auscultations. Patient died surrounded by family. i have complete postmortum care, be there to provide comfort for family. And body is to be transferred to the morgue..Jared Ingram

## 2013-10-11 NOTE — Progress Notes (Signed)
S: Refractory shock (SBP 80s) overnight despite maximum neo, levo and vasopressin.  Labs reveal continued acidosis - pH 7.156, bicarb 9, lactate 13.5 --> 19.0, increased trop 1.9-->2.9, hgb 17.8.  Developed agonal rhythm this AM.  Intensivist has discussed poor prognosis with family.  Pressors have been stopped.  Family is at bedside.  O:BP 55/24  Pulse 35  Temp(Src) 99.4 F (37.4 C) (Axillary)  Resp 21  Ht 5\' 11"  (1.803 m)  Wt 108.6 kg (239 lb 6.7 oz)  BMI 33.41 kg/m2  SpO2 96%  Intake/Output Summary (Last 24 hours) at 09/20/2013 0735 Last data filed at 09/10/2013 0700  Gross per 24 hour  Intake 7321.44 ml  Output   4005 ml  Net 3316.44 ml   Intake/Output: I/O last 3 completed shifts: In: 7321.4 [I.V.:5021.4; IV Piggyback:2300] Out: 4005 [Urine:77; Other:3928]  Intake/Output this shift:    Weight change:  Family at bedside CVS:  Refractory hypotension Neuro:sedated and intubated on vent   Recent Labs Lab 09/10/2013 1343 10/08/2013 1600 09/22/2013 2000 09/27/2013 0400  NA 138 137 132* 135*  K 4.5 4.4 4.6 4.6  CL 101 100 95* 92*  CO2 10* 9* 9* 7*  GLUCOSE 108* 161* 196* 151*  BUN 59* 60* 59* 49*  CREATININE 3.08* 3.07* 3.06* 2.97*  ALBUMIN 1.8* 1.8*  --  1.5*  CALCIUM 6.8* 6.7* 6.5* 6.1*  PHOS 6.3* 6.5*  --  8.2*  AST 200*  --   --   --   ALT 90*  --   --   --    Liver Function Tests:  Recent Labs Lab 10/06/2013 1343 09/19/2013 1600 09/20/2013 0400  AST 200*  --   --   ALT 90*  --   --   ALKPHOS 84  --   --   BILITOT 4.8*  --   --   PROT 3.8*  --   --   ALBUMIN 1.8* 1.8* 1.5*    Recent Labs Lab 10/05/2013 1343 10/01/2013 0053  LIPASE 30 39  AMYLASE 182*  --    CBC:  Recent Labs Lab 09/20/2013 1343 09/12/2013 0053 09/24/2013 0400  WBC 16.2* 25.1* 26.0*  NEUTROABS  --  23.8*  --   HGB 16.2 17.8* 16.9  HCT 47.2 52.6* 49.6  MCV 91.1 92.4 91.9  PLT 65* 81* 84*   Cardiac Enzymes:  Recent Labs Lab 10/08/2013 1343 09/26/2013 1943 10/09/2013 0046  CKTOTAL  --   --   5565*  CKMB  --   --  158.9*  TROPONINI 1.90* 2.90* 2.90*   CBG:  Recent Labs Lab 09/17/2013 1602 09/25/2013 1937 10/05/2013 0001 09/13/2013 0800  GLUCAP 99 158* 140* 155*   Studies/Results: US Renal Port  09/11/2013   CLINICAL DATA:  Hematuria.  Oliguria.  Renal insufficiency.  EXAM: RENAL/URINARY TRACT ULTRASOUND COMPLETE  COMPARISON:  CT on 09/26/2013  FINDINGS: Right Kidney:  Length: 9.8 cm. Echogenicity within normal limits. No mass or hydronephrosis visualized.  Left Kidney:  Length: 11.6 cm. Echogenicity within normal limits. No mass or hydronephrosis visualized.  Bladder:  Nearly empty with Foley catheter seen in place.  IMPRESSION: Normal sonographic appearance of both kidneys. No evidence hydronephrosis.   Electronically Signed   By: Myles Rosenthal M.D.   On: 10/10/2013 15:58   Dg Chest Port 1 View  10/05/2013   CLINICAL DATA:  Dialysis catheter placement, history essential hypertension, GERD  EXAM: PORTABLE CHEST - 1 VIEW  COMPARISON:  Portable exam 1653 hr compared to 09/17/2013 at 1402 hr  FINDINGS: Tip of endotracheal tube projects 5.7 cm above carina.  Nasogastric tube extends into stomach.  LEFT jugular central venous catheter tip projects over distal LEFT brachiocephalic vein.  New RIGHT jugular central venous catheter with tip projecting over proximal SVC.  Normal heart size, mediastinal contours, and pulmonary vascularity.  Atelectasis versus consolidation in LEFT lower lobe unchanged.  Mild RIGHT basilar atelectasis and small pleural effusion.  No pneumothorax.  IMPRESSION: No pneumothorax following central line placement.  Otherwise no change.   Electronically Signed   By: Ulyses SouthwardMark  Boles M.D.   On: 10/01/2013 17:13   Dg Chest Port 1 View  10/04/2013   CLINICAL DATA:  Respiratory failure.  EXAM: PORTABLE CHEST - 1 VIEW  COMPARISON:  10/04/2013.  FINDINGS: Endotracheal tube, left IJ line, NG tube in stable position. Low lung volumes with basilar atelectasis. Mild basilar infiltrates cannot  be excluded. Small left pleural effusion cannot be excluded. Cardiomegaly. No prominent pulmonary venous congestion. No acute osseous abnormality.  IMPRESSION: 1. Line and tube stable in position. 2. Low lung volumes with basilar atelectasis and/or mild infiltrates. Small left pleural effusion cannot be excluded. 3. Cardiomegaly.  No pulmonary venous distention.   Electronically Signed   By: Maisie Fushomas  Register   On: 09/12/2013 14:20   . antiseptic oral rinse  7 mL Mouth Rinse QID  . chlorhexidine  15 mL Mouth Rinse BID  . doxycycline (VIBRAMYCIN) IV  100 mg Intravenous Q12H  . fentaNYL  50 mcg Intravenous Once  . hydrocortisone sod succinate (SOLU-CORTEF) inj  100 mg Intravenous Q6H  . imipenem-cilastatin  500 mg Intravenous Q6H  . metronidazole  500 mg Intravenous 3 times per day  . pantoprazole (PROTONIX) IV  40 mg Intravenous QHS  . vancomycin  500 mg Oral 4 times per day  . vancomycin  1,000 mg Intravenous Q24H    BMET    Component Value Date/Time   NA 135* 2013/02/17 0400   K 4.6 2013/02/17 0400   CL 92* 2013/02/17 0400   CO2 7* 2013/02/17 0400   GLUCOSE 151* 2013/02/17 0400   BUN 49* 2013/02/17 0400   CREATININE 2.97* 2013/02/17 0400   CALCIUM 6.1* 2013/02/17 0400   GFRNONAA 20* 2013/02/17 0400   GFRAA 24* 2013/02/17 0400   CBC    Component Value Date/Time   WBC 26.0* 2013/02/17 0400   RBC 5.40 2013/02/17 0400   HGB 16.9 2013/02/17 0400   HCT 49.6 2013/02/17 0400   PLT 84* 2013/02/17 0400   MCV 91.9 2013/02/17 0400   MCH 31.3 2013/02/17 0400   MCHC 34.1 2013/02/17 0400   RDW 14.6 2013/02/17 0400   LYMPHSABS 0.5* 2013/02/17 0053   MONOABS 0.8 2013/02/17 0053   EOSABS 0.0 2013/02/17 0053   BASOSABS 0.0 2013/02/17 0053   Assessment/Plan:  1. Oliguric AKI secondary to volume depletion (N/V) in the setting of ARB use, hypotension and contrast induced nephropathy.  Was placed on CVVHD on 08/20 in the setting of severe acidosis, continued need for IVF/pressor support and poor UOP.  In refractory  shock (despite four pressors).  Still anuric, only 10 cc this AM.  Per Intensivist's note, prognosis is poor and family has been informed he will likely die even with continued support.  Pressors have been stopped.          - if family has decided on comfort care will d/c CVVHD and focus on              keeping patient comfortable 2. High AG  metabolic acidosis - 2/2 to lactic acidosis (lactic acid 11.1-->19.0).          - on bicarb gtt with CVVHD; can d/c if moving to comfort care 3. Septic shock - management by PCCM  4. Acute respiratory failure - vent management per PCCM 5. NSTEMI - management per PCCM and cardiology  Evelena Peat, DO  [place], [place]  [phone] 22-Oct-2013 7:35AM

## 2013-10-11 NOTE — Procedures (Signed)
Extubation Procedure Note  Patient Details:   Name: Jared Ingram DOB: June 08, 1945 MRN: [id number]   Airway Documentation:     Evaluation  Pt deceased. ET tube removed.       Armando GangMike, Ellaina Schuler C 2013-09-06, 9:57 AM

## 2013-10-11 NOTE — Progress Notes (Signed)
Critical blood gas given to Dr Kendrick FriesMcQuaid

## 2013-10-11 NOTE — Progress Notes (Signed)
I have discussed case with Dr. Andrey CampanileWilson, however the patient expired prior to exam and agree with plan to withdraw care. Isais Klipfel A,MD 07/18/13 10:24 AM

## 2013-10-11 NOTE — Progress Notes (Signed)
eLink Physician-Brief Progress Note Patient Name: Tonny Branchaul Kostelecky DOB: 04-May-1945 MRN: [id number]   Date of Service  2013-08-08  HPI/Events of Note  RN calling due to refractory shock sbp 80s - patient on neo 200, levo 50 and vasopressin  Labs earlier 4-12 hours ago show lactate 11, severe acidosis  No obvious bleed per RN and on camera exam  Patient on CRRT with bicarb  eICU Interventions  Stat abg -> Stat amp bicarb Stat lactate Stat trop Stat cbc rule out bleed Stat INR 1L fluid bollus  NP to bedside for goals of care (full code right now) - concern patient is actively dying       Intervention Category Major Interventions: Acid-Base disturbance - evaluation and management;Shock - evaluation and management  Jamarkus Lisbon 2013-08-08, 12:43 AM

## 2013-10-11 NOTE — Progress Notes (Signed)
[facility] PCCM  Mr. Jared Ingram developed an agonal rhythm about 30 minutes ago, has very minimal, non-perfusing waveform on arterial line.  Discussed with family, further medical support of no benefit.  Plan: Stop pressors Maintain fentanyl  Family understands patient will die  Heber CarolinaBrent McQuaid, MD [place] PCCM Pager: [phone] Cell: [phone] If no response, call [phone]

## 2013-10-11 NOTE — Progress Notes (Signed)
[facility]  Date of Admission:  10/15/13  Antibiotics: Vancomycin Imipenem Oral vancomycin Flagyl doxycycline  Subjective: Continues to decline, withdrawing pressor support  Objective: Temp:  [97.6 F (36.4 C)-99.4 F (37.4 C)] 99.4 F (37.4 C) (08/21 0415) Pulse Rate:  [25-100] 25 (08/21 0845) Resp:  [16-37] 17 (08/21 0915) BP: (53-132)/(24-92) 55/24 mmHg (08/21 0702) SpO2:  [68 %-100 %] 71 % (08/21 0845) Arterial Line BP: (36-122)/(29-63) 36/29 mmHg (08/21 0915) FiO2 (%):  [50 %] 50 % (08/21 0333) Weight:  [223 lb 12.3 oz (101.5 kg)-239 lb 6.7 oz (108.6 kg)] 239 lb 6.7 oz (108.6 kg) (08/21 0436)    Lab Results Lab Results  Component Value Date   WBC 26.0* 09/27/2013   HGB 16.9 09/21/2013   HCT 49.6 09/28/2013   MCV 91.9 09/23/2013   PLT 84* 09/15/2013    Lab Results  Component Value Date   CREATININE 2.97* 09/17/2013   BUN 49* 09/15/2013   NA 135* 09/29/2013   K 4.6 09/29/2013   CL 92* 09/21/2013   CO2 7* 09/25/2013    Lab Results  Component Value Date   ALT 90* 15-Oct-2013   AST 200* 10/15/2013   ALKPHOS 84 October 15, 2013   BILITOT 4.8* 2013-10-15      Microbiology: Recent Results (from the past 240 hour(s))  CULTURE, BLOOD (ROUTINE X 2)     Status: None   Collection Time    Oct 15, 2013  3:00 PM      Result Value Ref Range Status   Specimen Description BLOOD LEFT HAND   Final   Special Requests BOTTLES DRAWN AEROBIC ONLY 10CC   Final   Culture  Setup Time     Final   Value: 2013-10-15 21:15     Performed at [facility]   Culture     Final   Value:        BLOOD CULTURE RECEIVED NO GROWTH TO DATE CULTURE WILL BE HELD FOR 5 DAYS BEFORE ISSUING A FINAL NEGATIVE REPORT     Performed at [facility]   Report Status PENDING   Incomplete  CULTURE, BLOOD (ROUTINE X 2)     Status: None   Collection Time    October 15, 2013  3:05 PM      Result Value Ref Range Status   Specimen Description BLOOD LEFT ARM   Final   Special Requests  BOTTLES DRAWN AEROBIC ONLY 3CC   Final   Culture  Setup Time     Final   Value: 10/15/2013 21:16     Performed at [facility]   Culture     Final   Value:        BLOOD CULTURE RECEIVED NO GROWTH TO DATE CULTURE WILL BE HELD FOR 5 DAYS BEFORE ISSUING A FINAL NEGATIVE REPORT     Performed at [facility]   Report Status PENDING   Incomplete    Studies/Results: US Renal Port  2013-10-15   CLINICAL DATA:  Hematuria.  Oliguria.  Renal insufficiency.  EXAM: RENAL/URINARY TRACT ULTRASOUND COMPLETE  COMPARISON:  CT on 09/26/2013  FINDINGS: Right Kidney:  Length: 9.8 cm. Echogenicity within normal limits. No mass or hydronephrosis visualized.  Left Kidney:  Length: 11.6 cm. Echogenicity within normal limits. No mass or hydronephrosis visualized.  Bladder:  Nearly empty with Foley catheter seen in place.  IMPRESSION: Normal sonographic appearance of both kidneys. No evidence hydronephrosis.   Electronically Signed   By: Alver Sorrow.D.  On: 10/01/2013 15:58   Dg Chest Port 1 View  09/21/2013   CLINICAL DATA:  Respiratory failure.  EXAM: PORTABLE CHEST - 1 VIEW  COMPARISON:  10/01/2013  FINDINGS: NG tube tip is in the stomach. Endotracheal tube is in good position. Right central venous catheter is in the superior vena cava just above the azygos vein. Left central line is in the left innominate vein adjacent to the superior vena cava.  There are persistent small bilateral pleural effusions with slight atelectasis/ consolidation at the left lung base.  Pulmonary vascularity is normal.  Heart size is normal.  IMPRESSION: No significant change. Persistent small bilateral effusions and left base atelectasis/consolidation.   Electronically Signed   By: Geanie CooleyJim  Maxwell M.D.   On: 09/21/2013 08:09   Dg Chest Port 1 View  09/27/2013   CLINICAL DATA:  Dialysis catheter placement, history essential hypertension, GERD  EXAM: PORTABLE CHEST - 1 VIEW  COMPARISON:  Portable exam 1653 hr compared to  09/12/2013 at 1402 hr  FINDINGS: Tip of endotracheal tube projects 5.7 cm above carina.  Nasogastric tube extends into stomach.  LEFT jugular central venous catheter tip projects over distal LEFT brachiocephalic vein.  New RIGHT jugular central venous catheter with tip projecting over proximal SVC.  Normal heart size, mediastinal contours, and pulmonary vascularity.  Atelectasis versus consolidation in LEFT lower lobe unchanged.  Mild RIGHT basilar atelectasis and small pleural effusion.  No pneumothorax.  IMPRESSION: No pneumothorax following central line placement.  Otherwise no change.   Electronically Signed   By: Ulyses SouthwardMark  Boles M.D.   On: 09/12/2013 17:13   Dg Chest Port 1 View  09/25/2013   CLINICAL DATA:  Respiratory failure.  EXAM: PORTABLE CHEST - 1 VIEW  COMPARISON:  09/23/2013.  FINDINGS: Endotracheal tube, left IJ line, NG tube in stable position. Low lung volumes with basilar atelectasis. Mild basilar infiltrates cannot be excluded. Small left pleural effusion cannot be excluded. Cardiomegaly. No prominent pulmonary venous congestion. No acute osseous abnormality.  IMPRESSION: 1. Line and tube stable in position. 2. Low lung volumes with basilar atelectasis and/or mild infiltrates. Small left pleural effusion cannot be excluded. 3. Cardiomegaly.  No pulmonary venous distention.   Electronically Signed   By: Maisie Fushomas  Register   On: 10/07/2013 14:20    Assessment/Plan: 1) Shock of unknown etiology - likely septic from unknown organism.  Supportive care not helping, remains acidotic.  Full antibiotic support.  No further medical support beneficial.   - we will be available if needed but will not follow up otherwise.    Staci RighterOMER, Hibba Schram, MD [email or web address] [phone] pager   [phone] cell 09/12/2013, 9:35 AM

## 2013-10-11 NOTE — Progress Notes (Signed)
Critical value [facility] 158.9 called to Dr Marchelle Gearing. Order to call back when CK total is posted.

## 2013-10-11 DEATH — deceased

## 2015-11-26 IMAGING — CR DG CHEST 1V PORT
1 series · 1 of 1 positions shown · non-contrast
Comparison: Portable exam 8611 hr compared to 09/29/2013 at 5426 hr

CLINICAL DATA: Dialysis catheter placement, history essential
hypertension, GERD

EXAM:
PORTABLE CHEST - 1 VIEW

[AP]
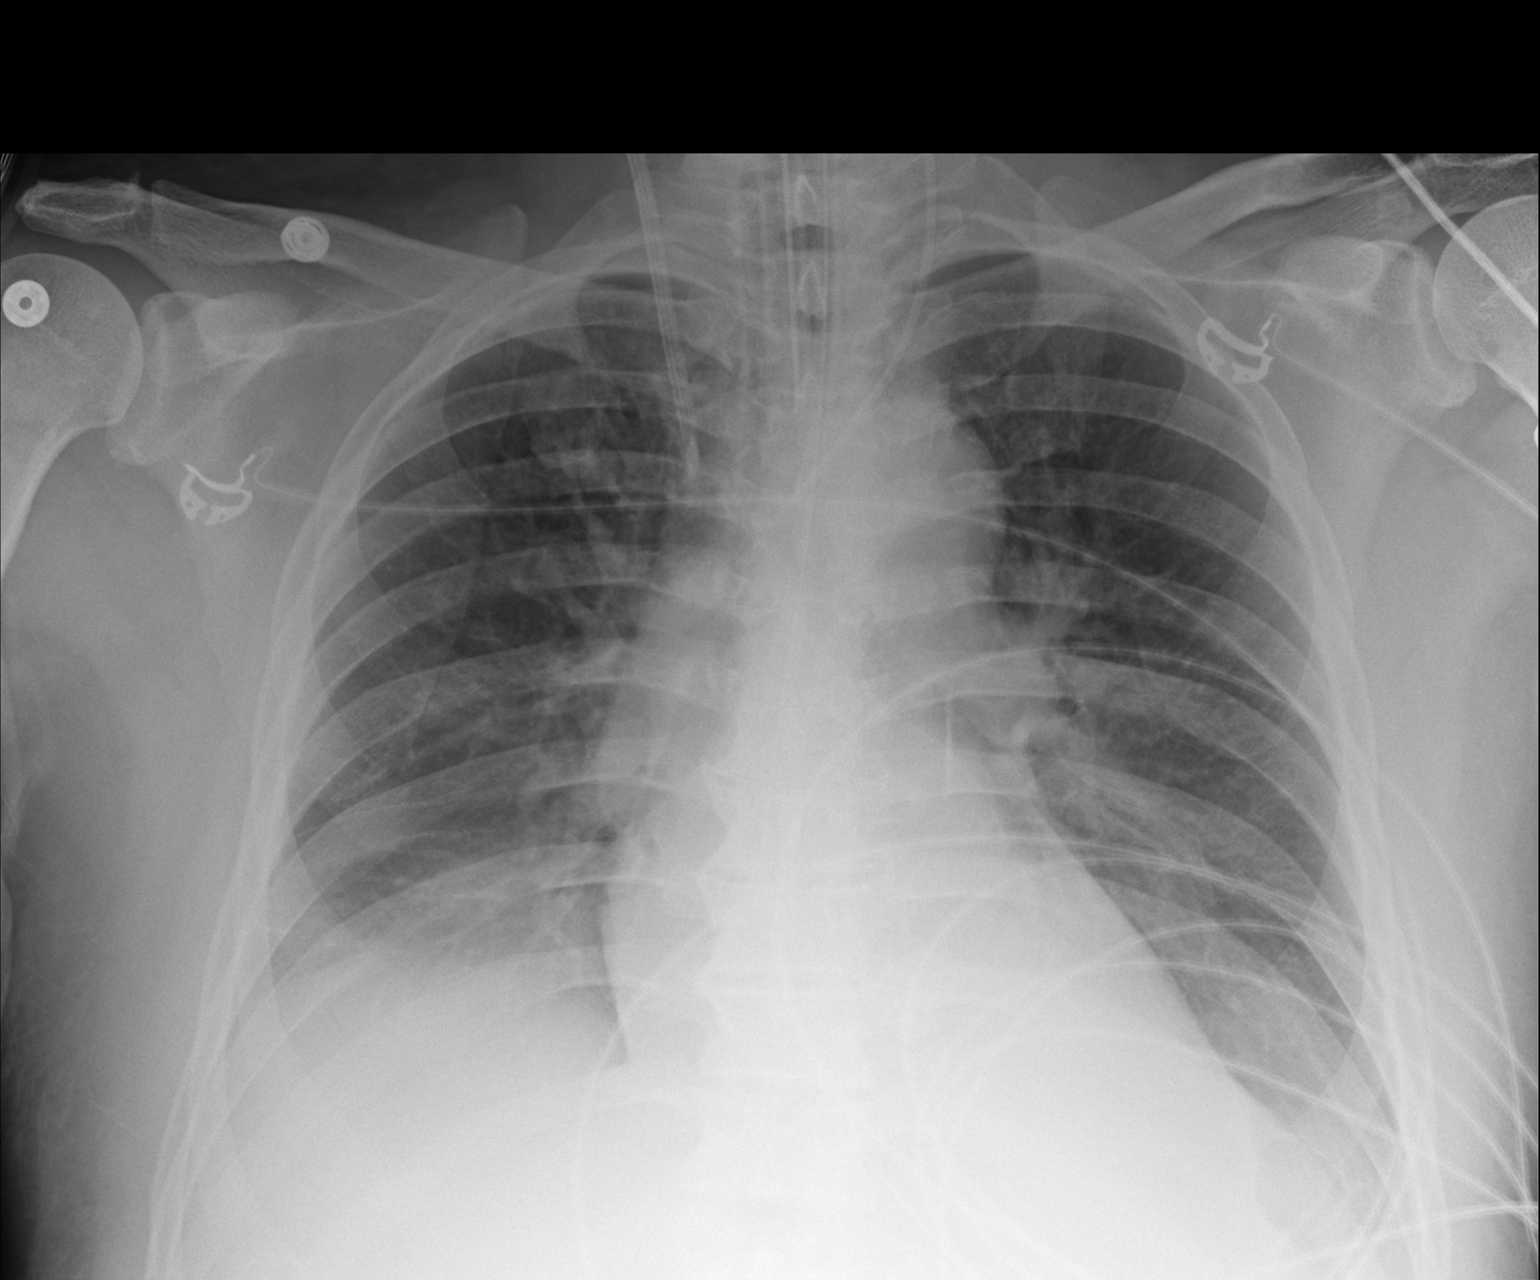

[1 of 1 positions shown; findings below may reference images not displayed]

FINDINGS: Tip of endotracheal tube projects 5.7 cm above carina.

Nasogastric tube extends into stomach.

LEFT jugular central venous catheter tip projects over distal LEFT
brachiocephalic vein.

New RIGHT jugular central venous catheter with tip projecting over
proximal SVC.

Normal heart size, mediastinal contours, and pulmonary vascularity.

Atelectasis versus consolidation in LEFT lower lobe unchanged.

Mild RIGHT basilar atelectasis and small pleural effusion.

No pneumothorax.
IMPRESSION: No pneumothorax following central line placement.

Otherwise no change.

## 2015-11-27 IMAGING — CR DG CHEST 1V PORT
1 series · 1 of 1 positions shown · non-contrast
Comparison: 09/29/2013

CLINICAL DATA: Respiratory failure.

EXAM:
PORTABLE CHEST - 1 VIEW

[AP]
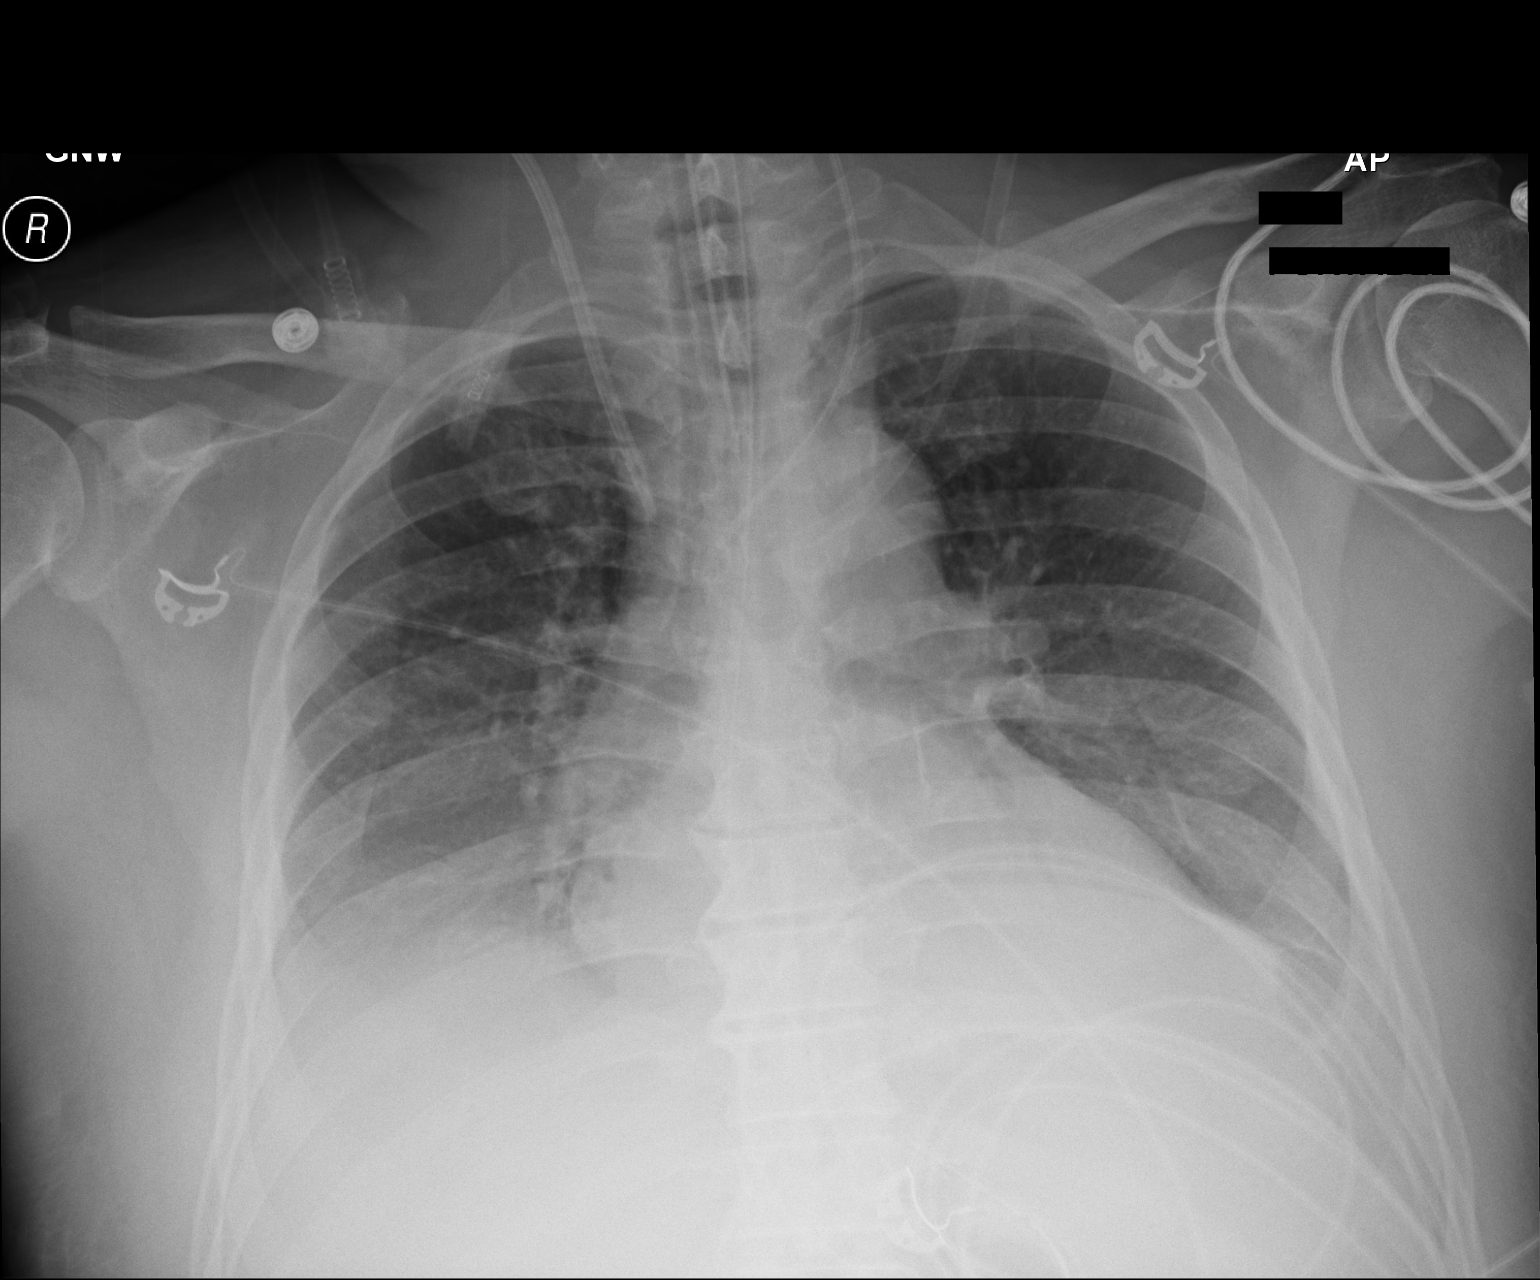

[1 of 1 positions shown; findings below may reference images not displayed]

FINDINGS: NG tube tip is in the stomach. Endotracheal tube is in good
position. Right central venous catheter is in the superior vena cava
just above the azygos vein. Left central line is in the left
innominate vein adjacent to the superior vena cava.

There are persistent small bilateral pleural effusions with slight
atelectasis/ consolidation at the left lung base.

Pulmonary vascularity is normal.  Heart size is normal.
IMPRESSION: No significant change. Persistent small bilateral effusions and left
base atelectasis/consolidation.
